# Patient Record
Sex: Female | Born: 1960 | Race: Black or African American | Hispanic: No | Smoking: Former smoker
Health system: Southern US, Community
[De-identification: ages and names within clinical notes are randomized; demographics above are authoritative.]

## PROBLEM LIST (undated history)

## (undated) DIAGNOSIS — D649 Anemia, unspecified: Secondary | ICD-10-CM

---

## 1988-01-17 HISTORY — PX: ABDOMINAL HYSTERECTOMY: SHX81

## 1997-12-09 ENCOUNTER — Ambulatory Visit (HOSPITAL_COMMUNITY): Admission: RE | Admit: 1997-12-09 | Discharge: 1997-12-09 | Payer: Self-pay | Admitting: Gastroenterology

## 1997-12-09 ENCOUNTER — Encounter: Payer: Self-pay | Admitting: Gastroenterology

## 1998-03-10 ENCOUNTER — Other Ambulatory Visit: Admission: RE | Admit: 1998-03-10 | Discharge: 1998-03-10 | Payer: Self-pay | Admitting: Obstetrics

## 1999-08-11 ENCOUNTER — Other Ambulatory Visit: Admission: RE | Admit: 1999-08-11 | Discharge: 1999-08-11 | Payer: Self-pay | Admitting: Obstetrics

## 1999-08-30 ENCOUNTER — Ambulatory Visit (HOSPITAL_COMMUNITY): Admission: RE | Admit: 1999-08-30 | Discharge: 1999-08-30 | Payer: Self-pay | Admitting: Obstetrics

## 1999-08-30 ENCOUNTER — Encounter: Payer: Self-pay | Admitting: Obstetrics

## 2000-07-01 ENCOUNTER — Encounter: Payer: Self-pay | Admitting: Emergency Medicine

## 2000-07-01 ENCOUNTER — Emergency Department (HOSPITAL_COMMUNITY): Admission: EM | Admit: 2000-07-01 | Discharge: 2000-07-01 | Payer: Self-pay | Admitting: Emergency Medicine

## 2000-09-20 ENCOUNTER — Ambulatory Visit (HOSPITAL_COMMUNITY): Admission: RE | Admit: 2000-09-20 | Discharge: 2000-09-20 | Payer: Self-pay | Admitting: Obstetrics

## 2000-09-20 ENCOUNTER — Encounter: Payer: Self-pay | Admitting: Obstetrics

## 2003-01-17 HISTORY — PX: COLON SURGERY: SHX602

## 2005-07-07 ENCOUNTER — Emergency Department (HOSPITAL_COMMUNITY): Admission: EM | Admit: 2005-07-07 | Discharge: 2005-07-07 | Payer: Self-pay | Admitting: Emergency Medicine

## 2005-09-04 ENCOUNTER — Emergency Department (HOSPITAL_COMMUNITY): Admission: EM | Admit: 2005-09-04 | Discharge: 2005-09-05 | Payer: Self-pay | Admitting: Emergency Medicine

## 2006-01-02 ENCOUNTER — Encounter: Admission: RE | Admit: 2006-01-02 | Discharge: 2006-01-02 | Payer: Self-pay | Admitting: Internal Medicine

## 2008-03-26 ENCOUNTER — Emergency Department (HOSPITAL_COMMUNITY): Admission: EM | Admit: 2008-03-26 | Discharge: 2008-03-27 | Payer: Self-pay | Admitting: Emergency Medicine

## 2009-08-30 ENCOUNTER — Ambulatory Visit: Payer: Self-pay | Admitting: Internal Medicine

## 2009-08-30 ENCOUNTER — Encounter (INDEPENDENT_AMBULATORY_CARE_PROVIDER_SITE_OTHER): Payer: Self-pay | Admitting: Family Medicine

## 2009-08-30 LAB — CONVERTED CEMR LAB
ALT: 12 units/L (ref 0–35)
Chloride: 105 meq/L (ref 96–112)
Cholesterol: 231 mg/dL — ABNORMAL HIGH (ref 0–200)
Creatinine, Ser: 0.7 mg/dL (ref 0.40–1.20)
Glucose, Bld: 80 mg/dL (ref 70–99)
Iron: 27 ug/dL — ABNORMAL LOW (ref 42–145)
Lymphocytes Relative: 32 % (ref 12–46)
Lymphs Abs: 1.7 10*3/uL (ref 0.7–4.0)
Monocytes Absolute: 0.5 10*3/uL (ref 0.1–1.0)
Monocytes Relative: 9 % (ref 3–12)
Neutrophils Relative %: 56 % (ref 43–77)
Platelets: 439 10*3/uL — ABNORMAL HIGH (ref 150–400)
Potassium: 3.9 meq/L (ref 3.5–5.3)
RDW: 13.2 % (ref 11.5–15.5)
Sodium: 141 meq/L (ref 135–145)
Total Bilirubin: 0.3 mg/dL (ref 0.3–1.2)
Total Protein: 7.8 g/dL (ref 6.0–8.3)
UIBC: >450 ug/dL

## 2009-08-31 ENCOUNTER — Encounter (INDEPENDENT_AMBULATORY_CARE_PROVIDER_SITE_OTHER): Payer: Self-pay | Admitting: Family Medicine

## 2009-09-15 ENCOUNTER — Ambulatory Visit (HOSPITAL_COMMUNITY): Admission: RE | Admit: 2009-09-15 | Discharge: 2009-09-15 | Payer: Self-pay | Admitting: Family Medicine

## 2009-09-24 ENCOUNTER — Encounter: Admission: RE | Admit: 2009-09-24 | Discharge: 2009-09-24 | Payer: Self-pay | Admitting: Family Medicine

## 2010-04-28 LAB — DIFFERENTIAL
Basophils Relative: 0 % (ref 0–1)
Eosinophils Absolute: 0.1 10*3/uL (ref 0.0–0.7)
Eosinophils Relative: 1 % (ref 0–5)
Lymphocytes Relative: 34 % (ref 12–46)
Monocytes Absolute: 0.7 10*3/uL (ref 0.1–1.0)
Monocytes Relative: 12 % (ref 3–12)
Neutrophils Relative %: 53 % (ref 43–77)

## 2010-04-28 LAB — COMPREHENSIVE METABOLIC PANEL
AST: 17 U/L (ref 0–37)
Alkaline Phosphatase: 64 U/L (ref 39–117)
CO2: 25 mEq/L (ref 19–32)
Calcium: 9 mg/dL (ref 8.4–10.5)
Creatinine, Ser: 0.62 mg/dL (ref 0.4–1.2)
GFR calc non Af Amer: >60 mL/min (ref >60)
Glucose, Bld: 119 mg/dL — ABNORMAL HIGH (ref 70–99)
Sodium: 138 mEq/L (ref 135–145)

## 2010-04-28 LAB — CBC
HCT: 29.7 % — ABNORMAL LOW (ref 36.0–46.0)
MCHC: 33.5 g/dL (ref 30.0–36.0)
MCV: 87.5 fL (ref 78.0–100.0)
RBC: 3.39 MIL/uL — ABNORMAL LOW (ref 3.87–5.11)
RDW: 16 % — ABNORMAL HIGH (ref 11.5–15.5)
WBC: 5.8 10*3/uL (ref 4.0–10.5)

## 2010-04-28 LAB — LIPASE, BLOOD: Lipase: 25 U/L (ref 11–59)

## 2011-01-13 ENCOUNTER — Encounter: Payer: Self-pay | Admitting: *Deleted

## 2011-01-13 ENCOUNTER — Emergency Department (HOSPITAL_COMMUNITY): Payer: Self-pay

## 2011-01-13 ENCOUNTER — Emergency Department (HOSPITAL_COMMUNITY)
Admission: EM | Admit: 2011-01-13 | Discharge: 2011-01-13 | Disposition: A | Discharge status: Home / Self Care | Payer: Self-pay | Attending: Emergency Medicine | Admitting: Emergency Medicine

## 2011-01-13 DIAGNOSIS — R200 Anesthesia of skin: Secondary | ICD-10-CM

## 2011-01-13 DIAGNOSIS — M79609 Pain in unspecified limb: Secondary | ICD-10-CM | POA: Insufficient documentation

## 2011-01-13 DIAGNOSIS — M25559 Pain in unspecified hip: Secondary | ICD-10-CM | POA: Insufficient documentation

## 2011-01-13 DIAGNOSIS — R269 Unspecified abnormalities of gait and mobility: Secondary | ICD-10-CM | POA: Insufficient documentation

## 2011-01-13 DIAGNOSIS — R209 Unspecified disturbances of skin sensation: Secondary | ICD-10-CM | POA: Insufficient documentation

## 2011-01-13 HISTORY — DX: Anemia, unspecified: D64.9

## 2011-01-13 NOTE — ED Provider Notes (Signed)
Medical screening examination/treatment/procedure(s) were performed by non-physician practitioner and as supervising physician I was immediately available for consultation/collaboration.  Rufus Cypert T Aribelle Mccosh, MD 01/13/11 2341 

## 2011-01-13 NOTE — ED Provider Notes (Signed)
History     CSN: [id number]  Arrival date & time 01/13/11  1515   First MD Initiated Contact with Patient 01/13/11 1522      Chief Complaint  Patient presents with  . Leg Pain    (Consider location/radiation/quality/duration/timing/severity/associated sxs/prior treatment) HPI Comments: Patient states, that she's had left hip, leg pain with her foot being cool for the last 3 years.  This was following a dental procedure she has been seen by numerous physicians reassure her that there is no connection.  She has not seen Dr.  In over a year, because she's lost her insurance.  She has not taken any medication for this discomfort.  She feels as though her leg gives out on her when she is walking and her foot.  Always feels cold  Patient is a 50 y.o. female presenting with leg pain. The history is provided by the patient.  Leg Pain  The incident occurred more than 1 week ago. There was no injury mechanism. The pain is present in the left hip, left thigh, left ankle, left foot and left toes. The quality of the pain is described as aching. The pain is at a severity of 1/10. The pain is mild. The pain has been constant since onset. Associated symptoms include muscle weakness. Pertinent negatives include no numbness. The symptoms are aggravated by nothing. She has tried nothing for the symptoms.    Past Medical History  Diagnosis Date  . Anemia     History reviewed. No pertinent past surgical history.  History reviewed. No pertinent family history.  History  Substance Use Topics  . Smoking status: Current Everyday Smoker  . Smokeless tobacco: Not on file  . Alcohol Use: No    OB History    Grav Para Term Preterm Abortions TAB SAB Ect Mult Living                  Review of Systems  Constitutional: Negative for activity change.  HENT: Negative for neck pain.   Cardiovascular: Negative for leg swelling.  Musculoskeletal: Positive for arthralgias and gait problem. Negative for  myalgias, back pain and joint swelling.  Skin: Negative for color change, rash and wound.  Neurological: Negative for numbness.  Hematological: Negative.   Psychiatric/Behavioral: Negative.     Allergies  Acetaminophen  Home Medications   Current Outpatient Rx  Name Route Sig Dispense Refill  . ACETAMINOPHEN 500 MG PO TABS Oral Take 500 mg by mouth every 6 (six) hours as needed. For pain relief     . FERROUS GLUCONATE 325 MG PO TABS Oral Take 325 mg by mouth daily with breakfast.      . NAPHAZOLINE-PHENIRAMINE 0.025-0.3 % OP SOLN Both Eyes Place 1 drop into both eyes 4 (four) times daily as needed. For dry eyes       BP 168/103  Pulse 118  Temp(Src) 98.3 F (36.8 C) (Oral)  Resp 20  SpO2 98%  Physical Exam  Constitutional: She is oriented to person, place, and time. She appears well-developed and well-nourished.  HENT:  Head: Normocephalic.  Eyes: Pupils are equal, round, and reactive to light.  Neck: Normal range of motion.  Cardiovascular: Normal rate.   Pulmonary/Chest: Effort normal.  Musculoskeletal: She exhibits no edema and no tenderness.       Left hip: She exhibits no tenderness, no bony tenderness, no swelling, no crepitus and no deformity.  Neurological: She is oriented to person, place, and time.  Skin: Skin is warm and  dry.       + diatal pulses bilaterai,l feet cool to touch, no color change cap refil less than 3 sec     ED Course  Procedures (including critical care time)  Labs Reviewed - No data to display Dg Hip Complete Left  01/13/2011  *RADIOLOGY REPORT*  Clinical Data: 3-day history of left hip pain with limp.  No known injuries.  LEFT HIP - COMPLETE 2+ VIEW 01/13/2011:  Comparison: None.  Findings: No evidence of acute or subacute fracture or dislocation. Well-preserved joint space.  Well-preserved bone mineral density.  Included AP pelvis demonstrates a normal-appearing contralateral right hip.  Sacroiliac joints and symphysis pubis intact.  Visualized lower lumbar spine intact.  No intrinsic osseous abnormalities.  Note made of numerous pelvic phleboliths.  IMPRESSION: Normal examination.  Original Report Authenticated By: Arnell Sieving, M.D.     1. Numbness in left leg       MDM  This sensation of coldness, and weakness to the left leg has been persistent for the last 3 years, worse in the last 6 months.  Has not seen a primary care physician in the last year to 2 insurance problems.  She is able to ambulate, but feels as though the left leg is weak and will give out, she is also concerned that the foot was feels cold and then an appears of socks  she wears or external warming measures         Arman Filter, NP 01/13/11 2009  Arman Filter, NP 01/13/11 2014

## 2011-01-13 NOTE — ED Notes (Signed)
Pt. states this all started after a root canal that was done 3 years ago. Has been unable to sleep on her left side since then. Pt. states she slept on her left side 2 days and now unable to walk due to the pain and her foot in cold.

## 2011-01-13 NOTE — ED Notes (Signed)
Pt in c/o left leg pain x3 years, increased over last 2 days to where patient has increased pain with walking, states her left foot stays cold, CMS intact

## 2011-06-26 ENCOUNTER — Other Ambulatory Visit: Payer: Self-pay | Admitting: Family Medicine

## 2011-06-26 DIAGNOSIS — N63 Unspecified lump in unspecified breast: Secondary | ICD-10-CM

## 2011-07-04 ENCOUNTER — Ambulatory Visit
Admission: RE | Admit: 2011-07-04 | Discharge: 2011-07-04 | Disposition: A | Discharge status: Home / Self Care | Payer: No Typology Code available for payment source | Source: Ambulatory Visit | Attending: Family Medicine | Admitting: Family Medicine

## 2011-07-04 DIAGNOSIS — N63 Unspecified lump in unspecified breast: Secondary | ICD-10-CM

## 2011-07-05 ENCOUNTER — Encounter (HOSPITAL_COMMUNITY): Payer: Self-pay | Admitting: *Deleted

## 2011-07-05 ENCOUNTER — Emergency Department (HOSPITAL_COMMUNITY)
Admission: EM | Admit: 2011-07-05 | Discharge: 2011-07-05 | Disposition: A | Discharge status: Home / Self Care | Payer: Self-pay | Attending: Emergency Medicine | Admitting: Emergency Medicine

## 2011-07-05 DIAGNOSIS — F17200 Nicotine dependence, unspecified, uncomplicated: Secondary | ICD-10-CM | POA: Insufficient documentation

## 2011-07-05 DIAGNOSIS — N938 Other specified abnormal uterine and vaginal bleeding: Secondary | ICD-10-CM | POA: Insufficient documentation

## 2011-07-05 DIAGNOSIS — N63 Unspecified lump in unspecified breast: Secondary | ICD-10-CM | POA: Insufficient documentation

## 2011-07-05 DIAGNOSIS — N949 Unspecified condition associated with female genital organs and menstrual cycle: Secondary | ICD-10-CM | POA: Insufficient documentation

## 2011-07-05 LAB — DIFFERENTIAL
Basophils Absolute: 0 10*3/uL (ref 0.0–0.1)
Eosinophils Absolute: 0.1 10*3/uL (ref 0.0–0.7)
Eosinophils Relative: 1 % (ref 0–5)
Lymphocytes Relative: 36 % (ref 12–46)
Lymphs Abs: 1.8 10*3/uL (ref 0.7–4.0)
Monocytes Absolute: 0.4 10*3/uL (ref 0.1–1.0)
Monocytes Relative: 8 % (ref 3–12)
Neutro Abs: 2.7 10*3/uL (ref 1.7–7.7)

## 2011-07-05 LAB — CBC: MCV: 92.2 fL (ref 78.0–100.0)

## 2011-07-05 LAB — URINALYSIS, ROUTINE W REFLEX MICROSCOPIC
Bilirubin Urine: NEGATIVE
Ketones, ur: NEGATIVE mg/dL
Nitrite: NEGATIVE
Protein, ur: NEGATIVE mg/dL
Urobilinogen, UA: 0.2 mg/dL (ref 0.0–1.0)
pH: 5.5 (ref 5.0–8.0)

## 2011-07-05 LAB — URINE MICROSCOPIC-ADD ON

## 2011-07-05 LAB — APTT: aPTT: 36 seconds (ref 24–37)

## 2011-07-05 LAB — PROTIME-INR: Prothrombin Time: 12.9 seconds (ref 11.6–15.2)

## 2011-07-05 MED ORDER — SODIUM CHLORIDE 0.9 % IV BOLUS (SEPSIS)
1000.0000 mL | Freq: Once | INTRAVENOUS | Status: AC
  Administered 2011-07-05: 1000 mL via INTRAVENOUS

## 2011-07-05 MED ORDER — NAPROXEN 500 MG PO TABS: 500.0000 mg | ORAL_TABLET | Freq: Two times a day (BID) | ORAL | Status: AC

## 2011-07-05 NOTE — ED Notes (Signed)
Pt reports vaginal bleeding x18 days. States she has hx of cyst/ fibroid. Admits feeling nauseous at times. Denies otc medication or relief of pain

## 2011-07-05 NOTE — ED Notes (Signed)
Pt reports hx of abnormal menstrual cycles. States cycles have been prolonged for past few years and abnormally heavy. Pt has hx of anemia. Reports clots present with menses. Hx of cysts

## 2011-07-05 NOTE — Discharge Instructions (Signed)
Abnormal Uterine Bleeding Abnormal uterine bleeding can have many causes. Some cases are simply treated, while others are more serious. There are several kinds of bleeding that is considered abnormal, including:  Bleeding between periods.   Bleeding after sexual intercourse.   Spotting anytime in the menstrual cycle.   Bleeding heavier or more than normal.   Bleeding after menopause.  CAUSES  There are many causes of abnormal uterine bleeding. It can be present in teenagers, pregnant women, women during their reproductive years, and women who have reached menopause. Your caregiver will look for the more common causes depending on your age, signs, symptoms and your particular circumstance. Most cases are not serious and can be treated. Even the more serious causes, like cancer of the female organs, can be treated adequately if found in the early stages. That is why all types of bleeding should be evaluated and treated as soon as possible. DIAGNOSIS  Diagnosing the cause may take several kinds of tests. Your caregiver may:  Take a complete history of the type of bleeding.   Perform a complete physical exam and Pap smear.   Take an ultrasound on the abdomen showing a picture of the female organs and the pelvis.   Inject dye into the uterus and Fallopian tubes and X-ray them (hysterosalpingogram).   Place fluid in the uterus and do an ultrasound (sonohysterogrqphy).   Take a CT scan to examine the female organs and pelvis.   Take an MRI to examine the female organs and pelvis. There is no X-ray involved with this procedure.   Look inside the uterus with a telescope that has a light at the end (hysteroscopy).   Scrap the inside of the uterus to get tissue to examine (Dilatation and Curettage, D&C).   Look into the pelvis with a telescope that has a light at the end (laparoscopy). This is done through a very small cut (incision) in the abdomen.  TREATMENT  Treatment will depend on the  cause of the abnormal bleeding. It can include:  Doing nothing to allow the problem to take care of itself over time.   Hormone treatment.   Birth control pills.   Treating the medical condition causing the problem.   Laparoscopy.   Major or minor surgery   Destroying the lining of the uterus with electrical currant, laser, freezing or heat (uterine ablation).  HOME CARE INSTRUCTIONS   Follow your caregiver's recommendation on how to treat your problem.   See your caregiver if you missed a menstrual period and think you may be pregnant.   If you are bleeding heavily, count the number of pads/tampons you use and how often you have to change them. Tell this to your caregiver.   Avoid sexual intercourse until the problem is controlled.  SEEK MEDICAL CARE IF:   You have any kind of abnormal bleeding mentioned above.   You feel dizzy at times.   You are 51 years old and have not had a menstrual period yet.  SEEK IMMEDIATE MEDICAL CARE IF:   You pass out.   You are changing pads/tampons every 15 to 30 minutes.   You have belly (abdominal) pain.   You have a temperature of 100 F (37.8 C) or higher.   You become sweaty or weak.   You are passing large blood clots from the vagina.   You start to feel sick to your stomach (nauseous) and throw up (vomit).  Document Released: 01/02/2005 Document Revised: 12/22/2010 Document Reviewed: 05/28/2008 [employer]   Patient Information 2012 [employer]. 

## 2011-07-05 NOTE — ED Provider Notes (Signed)
History     CSN: [id number]  Arrival date & time 07/05/11  1404   First MD Initiated Contact with Patient 07/05/11 1701      Chief Complaint  Patient presents with  . Vaginal Bleeding    (Consider location/radiation/quality/duration/timing/severity/associated sxs/prior treatment) HPI Comments: States that she does not currently have an ob/gyn physician  Patient is a 51 y.o. female presenting with vaginal bleeding. The history is provided by the patient. No language interpreter was used.  Vaginal Bleeding This is a recurrent problem. The current episode started more than 2 days ago (18 days ago). The problem occurs constantly. The problem has been gradually worsening (has been using 4-5 night time pads daily.). Pertinent negatives include no chest pain, no headaches and no shortness of breath. Nothing aggravates the symptoms. Nothing relieves the symptoms. She has tried nothing for the symptoms. The treatment provided no relief.    Past Medical History  Diagnosis Date  . Anemia   . Anemia     History reviewed. No pertinent past surgical history.  No family history on file.  History  Substance Use Topics  . Smoking status: Current Everyday Smoker    Types: Cigarettes  . Smokeless tobacco: Not on file  . Alcohol Use: No    OB History    Grav Para Term Preterm Abortions TAB SAB Ect Mult Living                  Review of Systems  Constitutional: Positive for fatigue. Negative for fever, chills, activity change and appetite change.  HENT: Negative for congestion, sore throat, rhinorrhea, neck pain and neck stiffness.   Respiratory: Negative for cough and shortness of breath.   Cardiovascular: Negative for chest pain and palpitations.  Gastrointestinal: Negative for nausea and vomiting.  Genitourinary: Positive for urgency, frequency and vaginal bleeding. Negative for dysuria, flank pain, vaginal discharge, vaginal pain and pelvic pain.  Musculoskeletal: Negative for  myalgias, back pain and arthralgias.  Neurological: Positive for light-headedness. Negative for dizziness, weakness, numbness and headaches.  All other systems reviewed and are negative.    Allergies  Acetaminophen  Home Medications   Current Outpatient Rx  Name Route Sig Dispense Refill  . FERROUS GLUCONATE 325 MG PO TABS Oral Take 325 mg by mouth daily with breakfast.      . NAPHAZOLINE-PHENIRAMINE 0.025-0.3 % OP SOLN Both Eyes Place 1 drop into both eyes 4 (four) times daily as needed. For dry eyes     . RANITIDINE HCL 150 MG PO TABS Oral Take 150 mg by mouth 2 (two) times daily.    Marland Kitchen NAPROXEN 500 MG PO TABS Oral Take 1 tablet (500 mg total) by mouth 2 (two) times daily. 30 tablet 0    BP 121/70  Pulse 80  Temp 98.8 F (37.1 C) (Oral)  Resp 16  Ht 5\' 2"  (1.575 m)  Wt 135 lb (61.236 kg)  BMI 24.69 kg/m2  SpO2 100%  LMP 07/05/2011  Physical Exam  Nursing note and vitals reviewed. Constitutional: She is oriented to person, place, and time. She appears well-developed and well-nourished. No distress.  HENT:  Head: Normocephalic and atraumatic.  Mouth/Throat: Oropharynx is clear and moist.  Eyes: Conjunctivae and EOM are normal. Pupils are equal, round, and reactive to light.  Neck: Normal range of motion. Neck supple.  Cardiovascular: Normal rate, regular rhythm, normal heart sounds and intact distal pulses.  Exam reveals no gallop and no friction rub.   No murmur heard. Pulmonary/Chest: Effort  normal and breath sounds normal. No respiratory distress. She exhibits no tenderness.  Abdominal: Soft. Bowel sounds are normal. There is no tenderness. There is no rebound and no guarding.  Genitourinary: Cervix exhibits no motion tenderness. Right adnexum displays no mass, no tenderness and no fullness. Left adnexum displays no mass, no tenderness and no fullness.       Vaginal bleeding in vault  Musculoskeletal: Normal range of motion. She exhibits no edema and no tenderness.    Neurological: She is alert and oriented to person, place, and time. No cranial nerve deficit.  Skin: Skin is warm and dry. No rash noted.    ED Course  Procedures (including critical care time)   Date: 07/05/2011  Rate: 60  Rhythm: normal sinus rhythm  QRS Axis: normal  Intervals: normal  ST/T Wave abnormalities: normal  Conduction Disutrbances:none  Narrative Interpretation:   Old EKG Reviewed: unchanged  Labs Reviewed  URINALYSIS, ROUTINE W REFLEX MICROSCOPIC - Abnormal; Notable for the following:    Hgb urine dipstick SMALL (*)     All other components within normal limits  CBC - Abnormal; Notable for the following:    RBC 3.84 (*)     HCT 35.4 (*)     Platelets 439 (*)     All other components within normal limits  PREGNANCY, URINE  DIFFERENTIAL  PROTIME-INR  APTT  URINE MICROSCOPIC-ADD ON  GC/CHLAMYDIA PROBE AMP, GENITAL   US Breast Right  07/04/2011  *RADIOLOGY REPORT*  Clinical Data:  Palpable lump right breast  DIGITAL DIAGNOSTIC BILATERAL MAMMOGRAM WITH CAD AND RIGHT BREAST ULTRASOUND:  Comparison:  September 24, 2009, September 15, 2009  Findings:  CC and MLO views of bilateral breasts, spot tangential view of the right breast are submitted.   In the palpable area right breast, there are two adjacent massed.  Stable asymmetries identified in both breast unchanged. Mammographic images were processed with CAD.  Ultrasound is performed, showing two adjacent simple cysts at the right breast eight to nine o'clock position.  The more anterior mass 8 o'clock cyst measures 2.2 x 1.7 x 2.2 cm.  The right breast nine o'clock cyst measures 1.81 x 1.94 x 1.1 cm.  There are several smaller adjacent simple cysts.  These correlate to the mammographic finding.  IMPRESSION: Benign findings  RECOMMENDATION: Recommend routine screening mammogram in 1 year.  BI-RADS CATEGORY 2:  Benign finding(s).  Original Report Authenticated By: Sherian Rein, M.D.   Mm Digital Diagnostic Bilat  07/04/2011   *RADIOLOGY REPORT*  Clinical Data:  Palpable lump right breast  DIGITAL DIAGNOSTIC BILATERAL MAMMOGRAM WITH CAD AND RIGHT BREAST ULTRASOUND:  Comparison:  September 24, 2009, September 15, 2009  Findings:  CC and MLO views of bilateral breasts, spot tangential view of the right breast are submitted.   In the palpable area right breast, there are two adjacent massed.  Stable asymmetries identified in both breast unchanged. Mammographic images were processed with CAD.  Ultrasound is performed, showing two adjacent simple cysts at the right breast eight to nine o'clock position.  The more anterior mass 8 o'clock cyst measures 2.2 x 1.7 x 2.2 cm.  The right breast nine o'clock cyst measures 1.81 x 1.94 x 1.1 cm.  There are several smaller adjacent simple cysts.  These correlate to the mammographic finding.  IMPRESSION: Benign findings  RECOMMENDATION: Recommend routine screening mammogram in 1 year.  BI-RADS CATEGORY 2:  Benign finding(s).  Original Report Authenticated By: Sherian Rein, M.D.     1. Dysfunctional  uterine bleeding       MDM  Dysfunctional uterine bleeding with no evidence of anemia. Instructed to continue her iron tablets. Instructed to followup with her primary care decision as well as an OB/GYN physician. There is no indication for OCPs at this time. Provided strict return precautions.        Dayton Bailiff, MD 07/05/11 2051

## 2011-07-06 LAB — GC/CHLAMYDIA PROBE AMP, GENITAL: Chlamydia, DNA Probe: NEGATIVE

## 2014-08-25 ENCOUNTER — Encounter: Payer: Self-pay | Admitting: *Deleted

## 2014-09-02 ENCOUNTER — Ambulatory Visit (INDEPENDENT_AMBULATORY_CARE_PROVIDER_SITE_OTHER): Payer: No Typology Code available for payment source | Admitting: Internal Medicine

## 2014-09-02 ENCOUNTER — Encounter: Payer: Self-pay | Admitting: Internal Medicine

## 2014-09-02 VITALS — BP 140/90 | HR 79 | Temp 98.1°F | Resp 18 | Ht 63.5 in | Wt 154.2 lb

## 2014-09-02 DIAGNOSIS — F1290 Cannabis use, unspecified, uncomplicated: Secondary | ICD-10-CM

## 2014-09-02 DIAGNOSIS — R5383 Other fatigue: Secondary | ICD-10-CM

## 2014-09-02 DIAGNOSIS — R030 Elevated blood-pressure reading, without diagnosis of hypertension: Secondary | ICD-10-CM | POA: Diagnosis not present

## 2014-09-02 DIAGNOSIS — F1210 Cannabis abuse, uncomplicated: Secondary | ICD-10-CM | POA: Diagnosis not present

## 2014-09-02 DIAGNOSIS — Z720 Tobacco use: Secondary | ICD-10-CM | POA: Diagnosis not present

## 2014-09-02 DIAGNOSIS — H539 Unspecified visual disturbance: Secondary | ICD-10-CM | POA: Diagnosis not present

## 2014-09-02 DIAGNOSIS — Z1239 Encounter for other screening for malignant neoplasm of breast: Secondary | ICD-10-CM | POA: Diagnosis not present

## 2014-09-02 DIAGNOSIS — N926 Irregular menstruation, unspecified: Secondary | ICD-10-CM | POA: Diagnosis not present

## 2014-09-02 DIAGNOSIS — M2550 Pain in unspecified joint: Secondary | ICD-10-CM | POA: Diagnosis not present

## 2014-09-02 DIAGNOSIS — R3589 Other polyuria: Secondary | ICD-10-CM

## 2014-09-02 DIAGNOSIS — R358 Other polyuria: Secondary | ICD-10-CM | POA: Diagnosis not present

## 2014-09-02 DIAGNOSIS — M79674 Pain in right toe(s): Secondary | ICD-10-CM

## 2014-09-02 DIAGNOSIS — D509 Iron deficiency anemia, unspecified: Secondary | ICD-10-CM

## 2014-09-02 DIAGNOSIS — R209 Unspecified disturbances of skin sensation: Secondary | ICD-10-CM | POA: Diagnosis not present

## 2014-09-02 DIAGNOSIS — IMO0001 Reserved for inherently not codable concepts without codable children: Secondary | ICD-10-CM

## 2014-09-02 MED ORDER — TRAMADOL HCL 50 MG PO TABS: 50.0000 mg | ORAL_TABLET | Freq: Four times a day (QID) | ORAL | Status: DC | PRN

## 2014-09-02 NOTE — Patient Instructions (Signed)
Will call with referrals to GYN, podiatry and optometry  Will call with mammogram appt  Will call with lab results  Watch salt intake  Follow up in 1 month for CPE

## 2014-09-02 NOTE — Progress Notes (Signed)
Patient ID: Lauren Ruiz, female   DOB: [birth year], 54 y.o.   MRN: [id number]    Location:    [facility]   Place of Service:   OFFICE   Advanced Directive information Does patient have an advance directive?: No, Would patient like information on creating an advanced directive?: Yes - Educational materials given  Chief Complaint  Patient presents with  . Establish Care    New patient establish care  . Medical Management of Chronic Issues    Discuss Chlantix would like to try    HPI:  54 yo female seen today as a new pt. She has several concerns. She has not had a PCP in several years. She c/o significant almost debilitating fatigue x several yrs not relieved with vitamins  Right 1st toe pain intermittent and worsens with walking. It prevents her from wearing most shoes. The pain is sharp, shooting and occasional tingling. No relief with OTC remedies  Her 1st toenail right is thick and discolored. No relief with OTC herbal remedies. She has not tried rx med. She has not seen podiatry  She has intermittent leg and finger cramps with intermittent tingling. C/a DM as she has a FHx. She noticed hand numbness. Job requires repetitive motions in the past.  She has generalized pain and has to massage joints prior to work x 20 minutes. Tylenol does not help. She admits to smoking marijuana at times which does help the pain.  She has 4-5 time nocturia x few yrs. (+) insomnia.   She noticed leg cramps most days of the week that awaken her from sleep x several mos  She would like to stop smoking cigarettes. Sister has asthma and is currently on life support. Her father and grandfather both deceased from lung cancer (both smokers).   She still has menses but they are irregular now. She has fibroids and hx iron deficiency. Occasional pelvic pain. She would like to see GYN  She has a stomach ulcer hx and takes prn zantac.  She has seen the dentist repeatedly for left facial swelling. W/u has been  neg thus far. No dry mouth.  Past Medical History  Diagnosis Date  . Anemia   . Anemia     Past Surgical History  Procedure Laterality Date  . Abdominal hysterectomy  1990  . Colon surgery  2005    [facility]    Patient Care Team: Kirt Boys, DO as PCP - General (Internal Medicine)  Social History   Social History  . Marital Status: Divorced    Spouse Name: N/A  . Number of Children: N/A  . Years of Education: N/A   Occupational History  . Not on file.   Social History Main Topics  . Smoking status: Current Every Day Smoker -- 32 years    Types: Cigarettes  . Smokeless tobacco: Never Used  . Alcohol Use: 0.0 oz/week    0 Standard drinks or equivalent per week  . Drug Use: Yes  . Sexual Activity: Not on file   Other Topics Concern  . Not on file   Social History Narrative   Diet: No specfic   Do you drink/eat things with caffeine? yes   Marital status: Divorced                             What year were you married?1988  Do you live in a house, apartment, assisted living, condo, trailer, etc)? House   Is it one or more stories? No   How many persons live in your home? 2   Do you have any pets in your home? No   Current or past profession: Investment banker, operational, Clinical biochemist Rep. Tax Preparer   Do you exercise? No                                               Type & how often: No   Do you have a living will? No   Do you have a DNR Form? No   Do you have a POA/HPOA forms? No        reports that she has been smoking Cigarettes.  She has smoked for the past 32 years. She has never used smokeless tobacco. She reports that she drinks alcohol. She reports that she uses illicit drugs.  History reviewed. No pertinent family history. No family status information on file.     There is no immunization history on file for this patient.  Allergies  Allergen Reactions  . Acetaminophen Other (See Comments)    ulcers     Medications: Patient's Medications  New Prescriptions   No medications on file  Previous Medications   FERROUS GLUCONATE (FERGON) 325 MG TABLET    Take 325 mg by mouth daily with breakfast.     FERROUS SULFATE DRIED (FEOSOL) 200 (65 FE) MG TABS    Take 1 tablet by mouth daily.   MAGNESIUM GLUCONATE (MAGONATE) 500 MG TABLET    Take 500 mg by mouth daily.   MULTIPLE VITAMINS-MINERALS (CENTRUM SILVER ULTRA WOMENS) TABS    Take 1 tablet by mouth daily.   NAPHAZOLINE-PHENIRAMINE (NAPHCON-A) 0.025-0.3 % OPHTHALMIC SOLUTION    Place 1 drop into both eyes 4 (four) times daily as needed. For dry eyes    RANITIDINE (ZANTAC) 150 MG TABLET    Take 150 mg by mouth 2 (two) times daily.  Modified Medications   No medications on file  Discontinued Medications   No medications on file    Review of Systems  Constitutional: Positive for appetite change (loss) and fatigue (constant). Negative for fever, chills, diaphoresis and activity change.  HENT: Positive for dental problem (gum pain) and facial swelling (left parotid. dental w/u neg). Negative for ear pain and sore throat.   Eyes: Positive for visual disturbance (she would like to see eye specialist).       Dry eyes  Respiratory: Positive for cough and wheezing. Negative for chest tightness and shortness of breath.   Cardiovascular: Negative for chest pain, palpitations and leg swelling.  Gastrointestinal: Positive for abdominal pain. Negative for nausea, vomiting, diarrhea, constipation and blood in stool.       Flatulence  Endocrine:       Hot flashes  Genitourinary: Positive for urgency, frequency and menstrual problem. Negative for dysuria.  Musculoskeletal: Positive for myalgias, joint swelling (with stiffness) and arthralgias.  Skin:       Nail abnormality   Neurological: Positive for numbness (paresthesias). Negative for dizziness, tremors and headaches.  Psychiatric/Behavioral: Negative for sleep disturbance. The patient is not  nervous/anxious.        Increased stress; mood swings    Filed Vitals:   09/02/14 1114  BP: 140/90  Pulse: 79  Temp: 98.1 F (36.7 C)  TempSrc: Oral  Resp: 18  Height: 5' 3.5" (1.613 m)  Weight: 154 lb 3.2 oz (69.945 kg)  SpO2: 98%   Body mass index is 26.88 kg/(m^2).  Physical Exam  Constitutional: She is oriented to person, place, and time. She appears well-developed and well-nourished.  Looks tired in NAD  HENT:  Mouth/Throat: Oropharynx is clear and moist. No oropharyngeal exudate.  Eyes: Pupils are equal, round, and reactive to light. No scleral icterus.  Neck: Neck supple. Carotid bruit is not present. No tracheal deviation present. No thyromegaly present.  Cardiovascular: Normal rate, regular rhythm, normal heart sounds and intact distal pulses.  Exam reveals no gallop and no friction rub.   No murmur heard. No LE edema b/l. no calf TTP.   Pulmonary/Chest: Effort normal. No stridor. No respiratory distress. She has decreased breath sounds (base b/l). She has no wheezes. She has no rales.  Abdominal: Soft. Bowel sounds are normal. She exhibits no distension and no mass. There is no hepatomegaly. There is tenderness (suprapubic with fullness noted). There is no rebound and no guarding.  Musculoskeletal: She exhibits edema and tenderness.  Left shoulder reduced ROM with crepitus on movement; neg Apley scratch test; (+) Right Tinel's sign; grip strength intact  Lymphadenopathy:    She has no cervical adenopathy.  Neurological: She is alert and oriented to person, place, and time. She has normal reflexes.  Skin: Skin is warm and dry. No rash noted.  Psychiatric: She has a normal mood and affect. Her behavior is normal. Judgment and thought content normal.   Diabetic Foot Exam - Simple   Simple Foot Form  Diabetic Foot exam was performed with the following findings:  Yes 09/02/2014 12:00 PM  Visual Inspection  See comments:  Yes  Sensation Testing  Intact to touch and  monofilament testing bilaterally:  Yes  Pulse Check  Posterior Tibialis and Dorsalis pulse intact bilaterally:  Yes  Comments  Right 1st toenail thick and dystrophic appearing, loose. Toe joint is TTP and swollen. No calluses or ulcerations. FROM at all MTP joints. No palpable masses        Labs reviewed: None available   Assessment/Plan   ICD-9-CM ICD-10-CM   1. Polyuria with polydipsia 788.42 R35.8 CMP     Urinalysis with Reflex Microscopic     Hemoglobin A1c  2. Toe pain, right 729.5 M79.674   3. Paresthesias/numbness 782.0 R20.9 CMP     TSH  4. Pain, joint, multiple sites 719.49 M25.50 traMADol (ULTRAM) 50 MG tablet  5. Anemia, iron deficiency 280.9 D50.9 CBC with Differential  6. Elevated blood pressure (not hypertension) 796.2 R03.0 Urinalysis with Reflex Microscopic  7. Continuous tobacco abuse 305.1 Z72.0 Urinalysis with Reflex Microscopic  8. Marijuana use 305.20 F12.10   9. Other fatigue 780.79 R53.83 CBC with Differential     CMP     TSH    --r/o DM - check A1c  --check CBC w diff, CMP, TSH, UA  --refer to GYN, podiatry, optometry  --refer for mammogram  --watch salt intake  --smoking cessation/marijuana use cessation discussed and highly urged  --may need to check CXR given sx's and FHx  --f/u for CPE in 1 month. She has multiple concerns that will need to be addressed in the coming appts  Devlyn Parish S. Ancil Linsey  [facility] [address] [place], [place] [address] [phone] Cell (Monday-Friday 8 AM - 5 PM) [phone] After 5 PM and follow prompts

## 2014-09-03 LAB — COMPREHENSIVE METABOLIC PANEL
A/G RATIO: 1.7 (ref 1.1–2.5)
ALBUMIN: 4.5 g/dL (ref 3.5–5.5)
ALK PHOS: 74 IU/L (ref 39–117)
ALT: 23 IU/L (ref 0–32)
AST: 23 IU/L (ref 0–40)
BILIRUBIN TOTAL: 0.4 mg/dL (ref 0.0–1.2)
BUN / CREAT RATIO: 18 (ref 9–23)
BUN: 12 mg/dL (ref 6–24)
CHLORIDE: 99 mmol/L (ref 97–108)
CO2: 25 mmol/L (ref 18–29)
Calcium: 9.6 mg/dL (ref 8.7–10.2)
Creatinine, Ser: 0.65 mg/dL (ref 0.57–1.00)
GFR calc non Af Amer: 102 mL/min/{1.73_m2} (ref >59)
GFR, EST AFRICAN AMERICAN: 118 mL/min/{1.73_m2} (ref >59)
GLOBULIN, TOTAL: 2.6 g/dL (ref 1.5–4.5)
Glucose: 80 mg/dL (ref 65–99)
Potassium: 4.3 mmol/L (ref 3.5–5.2)
SODIUM: 138 mmol/L (ref 134–144)
TOTAL PROTEIN: 7.1 g/dL (ref 6.0–8.5)

## 2014-09-03 LAB — CBC WITH DIFFERENTIAL/PLATELET
BASOS ABS: 0 10*3/uL (ref 0.0–0.2)
BASOS: 0 %
EOS (ABSOLUTE): 0 10*3/uL (ref 0.0–0.4)
Eos: 1 %
HEMATOCRIT: 41.8 % (ref 34.0–46.6)
HEMOGLOBIN: 14 g/dL (ref 11.1–15.9)
IMMATURE GRANS (ABS): 0 10*3/uL (ref 0.0–0.1)
Immature Granulocytes: 0 %
LYMPHS ABS: 1.2 10*3/uL (ref 0.7–3.1)
LYMPHS: 39 %
MCH: 32.9 pg (ref 26.6–33.0)
MCHC: 33.5 g/dL (ref 31.5–35.7)
MCV: 98 fL — AB (ref 79–97)
MONOCYTES: 9 %
Monocytes Absolute: 0.3 10*3/uL (ref 0.1–0.9)
NEUTROS ABS: 1.6 10*3/uL (ref 1.4–7.0)
Neutrophils: 51 %
Platelets: 352 10*3/uL (ref 150–379)
RBC: 4.25 x10E6/uL (ref 3.77–5.28)
RDW: 13.9 % (ref 12.3–15.4)
WBC: 3.1 10*3/uL — ABNORMAL LOW (ref 3.4–10.8)

## 2014-09-03 LAB — URINALYSIS, ROUTINE W REFLEX MICROSCOPIC
Bilirubin, UA: NEGATIVE
GLUCOSE, UA: NEGATIVE
Ketones, UA: NEGATIVE
Leukocytes, UA: NEGATIVE
Nitrite, UA: NEGATIVE
PH UA: 6 (ref 5.0–7.5)
PROTEIN UA: NEGATIVE
RBC, UA: NEGATIVE
Specific Gravity, UA: 1.021 (ref 1.005–1.030)
UUROB: 0.2 mg/dL (ref 0.2–1.0)

## 2014-09-03 LAB — TSH: TSH: 1.43 u[IU]/mL (ref 0.450–4.500)

## 2014-09-03 LAB — HEMOGLOBIN A1C
ESTIMATED AVERAGE GLUCOSE: 108 mg/dL
HEMOGLOBIN A1C: 5.4 % (ref 4.8–5.6)

## 2014-09-16 ENCOUNTER — Ambulatory Visit (INDEPENDENT_AMBULATORY_CARE_PROVIDER_SITE_OTHER): Payer: No Typology Code available for payment source

## 2014-09-16 ENCOUNTER — Ambulatory Visit (INDEPENDENT_AMBULATORY_CARE_PROVIDER_SITE_OTHER): Payer: No Typology Code available for payment source | Admitting: Podiatry

## 2014-09-16 VITALS — BP 125/79 | HR 79 | Resp 16 | Ht 62.0 in | Wt 154.0 lb

## 2014-09-16 DIAGNOSIS — M79671 Pain in right foot: Secondary | ICD-10-CM

## 2014-09-16 DIAGNOSIS — B351 Tinea unguium: Secondary | ICD-10-CM | POA: Diagnosis not present

## 2014-09-16 DIAGNOSIS — M779 Enthesopathy, unspecified: Secondary | ICD-10-CM | POA: Diagnosis not present

## 2014-09-16 DIAGNOSIS — M79672 Pain in left foot: Secondary | ICD-10-CM

## 2014-09-16 DIAGNOSIS — M79673 Pain in unspecified foot: Secondary | ICD-10-CM

## 2014-09-16 MED ORDER — TERBINAFINE HCL 250 MG PO TABS: 250.0000 mg | ORAL_TABLET | Freq: Every day | ORAL | Status: DC

## 2014-09-16 NOTE — Patient Instructions (Signed)

## 2014-09-16 NOTE — Progress Notes (Signed)
   Subjective:    Patient ID: Lauren Ruiz, female    DOB: Jun 20, 1960, 54 y.o.   MRN: [id number]  HPI  Patient presents with bilateral toe pain, great toes; discoloration of nails and thickness of nails. Pt stated, "has shooting pain in toes, like a prickly feeling; hurts to touch". This has been going on for the past year.  Patient also presents with bilateral foot pain, heel and below ankle. This has been going on for past year. Pt has soaked with epsom salt with some relief.      Review of Systems  Constitutional: Positive for fatigue.  Eyes: Positive for redness, itching and visual disturbance.  Respiratory: Positive for wheezing.   Cardiovascular: Positive for leg swelling.  Endocrine: Positive for cold intolerance, polydipsia and polyuria.  Genitourinary: Positive for urgency and frequency.  All other systems reviewed and are negative.      Objective:   Physical Exam        Assessment & Plan:

## 2014-09-16 NOTE — Progress Notes (Signed)
Subjective:     Patient ID: Lauren Ruiz, female   DOB: 1960/07/04, 54 y.o.   MRN: [id number]  HPI patient is found to have thickness and yellow brittle-like appearance to the big toenail second toenail of both feet with about one year duration and no history of trauma   Review of Systems  All other systems reviewed and are negative.      Objective:   Physical Exam  Constitutional: She is oriented to person, place, and time.  Cardiovascular: Intact distal pulses.   Musculoskeletal: Normal range of motion.  Neurological: She is oriented to person, place, and time.  Skin: Skin is warm and dry.  Nursing note and vitals reviewed.  neurovascular status intact muscle strength adequate range of motion within normal limits. Patient's noted to have thickness and yellow brittle debris of the hallux and second nails bilateral with distal odor noted and localized-like process is going     Assessment:     Probable mycotic nail infection hallux and second nails bilateral    Plan:     H&P and condition reviewed with patient. Due to long-standing nature I did go ahead and recommended oral treatment and explain risk and reviewed her blood work that was just done several weeks ago indicating good liver function. Patient will take Lamisil 1 pill per day for 90 days and also begin topical agent and reappoint in 4 weeks or earlier if necessary

## 2014-09-28 ENCOUNTER — Ambulatory Visit
Admission: RE | Admit: 2014-09-28 | Discharge: 2014-09-28 | Disposition: A | Discharge status: Home / Self Care | Payer: No Typology Code available for payment source | Source: Ambulatory Visit | Attending: Internal Medicine | Admitting: Internal Medicine

## 2014-09-28 DIAGNOSIS — Z1239 Encounter for other screening for malignant neoplasm of breast: Secondary | ICD-10-CM

## 2014-10-07 ENCOUNTER — Encounter: Payer: Self-pay | Admitting: Internal Medicine

## 2014-10-07 ENCOUNTER — Ambulatory Visit (INDEPENDENT_AMBULATORY_CARE_PROVIDER_SITE_OTHER): Payer: No Typology Code available for payment source | Admitting: Internal Medicine

## 2014-10-07 VITALS — BP 126/92 | HR 86 | Temp 98.4°F | Resp 18 | Ht 62.0 in | Wt 160.4 lb

## 2014-10-07 DIAGNOSIS — D509 Iron deficiency anemia, unspecified: Secondary | ICD-10-CM | POA: Diagnosis not present

## 2014-10-07 DIAGNOSIS — Z1211 Encounter for screening for malignant neoplasm of colon: Secondary | ICD-10-CM | POA: Diagnosis not present

## 2014-10-07 DIAGNOSIS — D72819 Decreased white blood cell count, unspecified: Secondary | ICD-10-CM

## 2014-10-07 DIAGNOSIS — Z0000 Encounter for general adult medical examination without abnormal findings: Secondary | ICD-10-CM | POA: Diagnosis not present

## 2014-10-07 DIAGNOSIS — M2550 Pain in unspecified joint: Secondary | ICD-10-CM

## 2014-10-07 DIAGNOSIS — R030 Elevated blood-pressure reading, without diagnosis of hypertension: Secondary | ICD-10-CM | POA: Diagnosis not present

## 2014-10-07 DIAGNOSIS — Z720 Tobacco use: Secondary | ICD-10-CM | POA: Diagnosis not present

## 2014-10-07 NOTE — Progress Notes (Signed)
Patient ID: Lauren Ruiz, female   DOB: 06/19/1960, 54 y.o.   MRN: [id number] Subjective:     Lauren Ruiz is a 54 y.o. female and is here for a comprehensive physical exam. The patient reports no problems. She had a birthday since her last visit. Admits to poor food choices. She is not exercising. She is still smoking in the evenings (1/4 ppd).   She saw podiatry for foot pain. Nail cx taken but she has not rec'd results. In soles did not relieve foot pain  Tramadol did not relieve pain but it made her sleepy. She tried 1 tab  Past Medical History  Diagnosis Date  . Anemia   . Anemia    Past Surgical History  Procedure Laterality Date  . Abdominal hysterectomy  1990  . Colon surgery  2005    [facility]   History reviewed. No pertinent family history.   Social History   Social History  . Marital Status: Divorced    Spouse Name: N/A  . Number of Children: N/A  . Years of Education: N/A   Occupational History  . Not on file.   Social History Main Topics  . Smoking status: Current Every Day Smoker -- 32 years    Types: Cigarettes  . Smokeless tobacco: Never Used  . Alcohol Use: 0.0 oz/week    0 Standard drinks or equivalent per week  . Drug Use: Yes    Special: Marijuana  . Sexual Activity: Not on file   Other Topics Concern  . Not on file   Social History Narrative   Diet: No specfic   Do you drink/eat things with caffeine? yes   Marital status: Divorced                             What year were you married?1988                                      Do you live in a house, apartment, assisted living, condo, trailer, etc)? House   Is it one or more stories? No   How many persons live in your home? 2   Do you have any pets in your home? No   Current or past profession: Investment banker, operational, Clinical biochemist Rep. Tax Preparer   Do you exercise? No                                               Type & how often: No   Do you have a living will? No   Do you have a DNR Form?  No   Do you have a POA/HPOA forms? No      Health Maintenance  Topic Date Due  . Hepatitis C Screening  April 06, 1960  . HIV Screening  09/20/1976  . TETANUS/TDAP  09/20/1980  . PAP SMEAR  09/21/1982  . COLONOSCOPY  09/21/2011  . INFLUENZA VACCINE  08/17/2014  . MAMMOGRAM  09/27/2016    Review of Systems   Review of Systems  Constitutional: Negative for fever, chills and malaise/fatigue.  HENT: Negative for sore throat and tinnitus.   Eyes: Negative for blurred vision and double vision.  Respiratory: Negative for cough, shortness of breath and wheezing.  Cardiovascular: Negative for chest pain, palpitations, orthopnea and leg swelling.  Gastrointestinal: Negative for heartburn, nausea, vomiting, abdominal pain, diarrhea, constipation and blood in stool.  Genitourinary: Negative for dysuria, urgency, frequency and hematuria.  Musculoskeletal: Positive for joint pain. Negative for myalgias and falls.  Skin: Negative for rash.  Neurological: Positive for tingling. Negative for dizziness, tremors, sensory change, focal weakness, seizures, loss of consciousness, weakness and headaches.  Endo/Heme/Allergies: Negative for environmental allergies. Does not bruise/bleed easily.  Psychiatric/Behavioral: Negative for depression and memory loss. The patient is not nervous/anxious and does not have insomnia.      Objective:      Physical Exam  Constitutional: She is oriented to person, place, and time and well-developed, well-nourished, and in no distress.  HENT:  Head: Normocephalic and atraumatic.  Right Ear: External ear normal.  Left Ear: External ear normal.  Mouth/Throat: Oropharynx is clear and moist. No oropharyngeal exudate.  Eyes: Conjunctivae and EOM are normal. Pupils are equal, round, and reactive to light. No scleral icterus.  Neck: Normal range of motion. Neck supple. Carotid bruit is not present. No tracheal deviation present. No thyromegaly present.  Cardiovascular:  Normal rate, regular rhythm, normal heart sounds and intact distal pulses.  Exam reveals no gallop and no friction rub.   No murmur heard. Pulmonary/Chest: Effort normal and breath sounds normal. She has no wheezes. She has no rhonchi. She has no rales. She exhibits no tenderness. Right breast exhibits no inverted nipple, no mass, no nipple discharge, no skin change and no tenderness. Left breast exhibits no inverted nipple, no mass, no nipple discharge, no skin change and no tenderness. Breasts are symmetrical.  Abdominal: Soft. Bowel sounds are normal. She exhibits no distension and no mass. There is no hepatosplenomegaly. There is no tenderness. There is no rebound and no guarding.  Genitourinary:  Deferred to GYN  Musculoskeletal: She exhibits edema and tenderness.  Lymphadenopathy:    She has no cervical adenopathy.  Neurological: She is alert and oriented to person, place, and time. She has normal reflexes. Gait normal.  Skin: Skin is warm and dry. No rash noted.  B/l 1st and 2nd toenail yellow and thick. (+)dystrophy  Psychiatric: Mood, memory, affect and judgment normal.      Recent Results (from the past 2160 hour(s))  CBC with Differential     Status: Abnormal   Collection Time: 09/02/14 12:43 PM  Result Value Ref Range   WBC 3.1 (L) 3.4 - 10.8 x10E3/uL   RBC 4.25 3.77 - 5.28 x10E6/uL   Hemoglobin 14.0 11.1 - 15.9 g/dL   Hematocrit 09.8 11.9 - 46.6 %   MCV 98 (H) 79 - 97 fL   MCH 32.9 26.6 - 33.0 pg   MCHC 33.5 31.5 - 35.7 g/dL   RDW 14.7 82.9 - 56.2 %   Platelets 352 150 - 379 x10E3/uL   Neutrophils 51 %   Lymphs 39 %   Monocytes 9 %   Eos 1 %   Basos 0 %   Neutrophils Absolute 1.6 1.4 - 7.0 x10E3/uL   Lymphocytes Absolute 1.2 0.7 - 3.1 x10E3/uL   Monocytes Absolute 0.3 0.1 - 0.9 x10E3/uL   EOS (ABSOLUTE) 0.0 0.0 - 0.4 x10E3/uL   Basophils Absolute 0.0 0.0 - 0.2 x10E3/uL   Immature Granulocytes 0 %   Immature Grans (Abs) 0.0 0.0 - 0.1 x10E3/uL  CMP     Status: None    Collection Time: 09/02/14 12:43 PM  Result Value Ref Range   Glucose 80  65 - 99 mg/dL   BUN 12 6 - 24 mg/dL   Creatinine, Ser 1.61 0.57 - 1.00 mg/dL   GFR calc non Af Amer 102 >59 mL/min/1.73   GFR calc Af Amer 118 >59 mL/min/1.73   BUN/Creatinine Ratio 18 9 - 23   Sodium 138 134 - 144 mmol/L   Potassium 4.3 3.5 - 5.2 mmol/L   Chloride 99 97 - 108 mmol/L   CO2 25 18 - 29 mmol/L   Calcium 9.6 8.7 - 10.2 mg/dL   Total Protein 7.1 6.0 - 8.5 g/dL   Albumin 4.5 3.5 - 5.5 g/dL   Globulin, Total 2.6 1.5 - 4.5 g/dL   Albumin/Globulin Ratio 1.7 1.1 - 2.5   Bilirubin Total 0.4 0.0 - 1.2 mg/dL   Alkaline Phosphatase 74 39 - 117 IU/L   AST 23 0 - 40 IU/L   ALT 23 0 - 32 IU/L  TSH     Status: None   Collection Time: 09/02/14 12:43 PM  Result Value Ref Range   TSH 1.430 0.450 - 4.500 uIU/mL  Hemoglobin A1c     Status: None   Collection Time: 09/02/14 12:43 PM  Result Value Ref Range   Hgb A1c MFr Bld 5.4 4.8 - 5.6 %    Comment:          Pre-diabetes: 5.7 - 6.4          Diabetes: >6.4          Glycemic control for adults with diabetes: <7.0    Est. average glucose Bld gHb Est-mCnc 108 mg/dL  Urinalysis with Reflex Microscopic     Status: None   Collection Time: 09/02/14 12:45 PM  Result Value Ref Range   Specific Gravity, UA 1.021 1.005 - 1.030   pH, UA 6.0 5.0 - 7.5   Color, UA Yellow Yellow   Appearance Ur Clear Clear   Leukocytes, UA Negative Negative   Protein, UA Negative Negative/Trace   Glucose, UA Negative Negative   Ketones, UA Negative Negative   RBC, UA Negative Negative   Bilirubin, UA Negative Negative   Urobilinogen, Ur 0.2 0.2 - 1.0 mg/dL   Nitrite, UA Negative Negative   Microscopic Examination Comment     Comment: Microscopic not indicated and not performed.    Assessment:    Healthy female exam.     ICD-9-CM ICD-10-CM   1. Well adult exam V70.0 Z00.00   2. Leukopenia - probably benign finding 288.50 D72.819 CBC with Differential  3. Pain, joint,  multiple sites - uncontrolled 719.49 M25.50   4. Anemia, iron deficiency 280.9 D50.9   5. Elevated blood pressure (not hypertension) 796.2 R03.0   6. Continuous tobacco abuse 305.1 Z72.0   7. Special screening for malignant neoplasms, colon V76.51 Z12.11 Ambulatory referral to Gastroenterology     Plan:     See After Visit Summary for Counseling Recommendations    Pt is UTD on health maintenance. Vaccinations NOT UTD. She declined Influenza vaccine today. Pt does NOT maintain a healthy lifestyle. Encouraged pt to exercise 30-45 minutes 4-5 times per week. Eat a well balanced diet. Avoid smoking. Limit alcohol intake. Wear seatbelt when riding in the car. Wear sun block (SPF >50) when spending extended times outside.  Increase tramadol 2 tabs every 6 hrs as needed for pain  Continue other medications as ordered  Will call with GI referral and lab results  Smoking cessation discussed and highly urged  Follow up in 6 mos for routine visit  Monica S. Perlie Gold  [facility] [address] [place], [place] [address] [phone] Cell (Monday-Friday 8 AM - 5 PM) [phone] After 5 PM and follow prompts

## 2014-10-07 NOTE — Patient Instructions (Signed)
Encouraged her to exercise 30-45 minutes 4-5 times per week. Eat a well balanced diet. Avoid smoking. Limit alcohol intake. Wear seatbelt when riding in the car. Wear sun block (SPF >50) when spending extended times outside.  Increase tramadol 2 tabs every 6 hrs as needed for pain  Continue other medications as ordered  Will call with GI referral and lab results  Follow up in 6 mos for routine visit

## 2014-10-08 ENCOUNTER — Telehealth: Payer: Self-pay | Admitting: *Deleted

## 2014-10-08 NOTE — Telephone Encounter (Addendum)
Dr. Charlsie Merles reviewed pt's fungal culture results of 09/16/2014 as negative, order urea cream if pt wants to try.  Left message for pt to call for results.  Pt called for results.  I informed pt, Dr. Charlsie Merles had recommended a Urea cream for the toenail and we sold [id number] here for $22.00.  Pt states she didn't get to talk to Dr. Charlsie Merles but for 5 minutes if that much and he didn't address the tingling in her feet, just said she had plantar fasciitis sold her some inserts she couldn't use and left.  I told her I would inform Dr. Charlsie Merles and call with information.

## 2014-10-09 ENCOUNTER — Other Ambulatory Visit: Payer: Self-pay

## 2014-10-22 ENCOUNTER — Other Ambulatory Visit: Payer: No Typology Code available for payment source

## 2014-10-22 DIAGNOSIS — D509 Iron deficiency anemia, unspecified: Secondary | ICD-10-CM

## 2014-10-22 DIAGNOSIS — D72819 Decreased white blood cell count, unspecified: Secondary | ICD-10-CM

## 2014-10-23 LAB — CBC WITH DIFFERENTIAL/PLATELET
BASOS ABS: 0 10*3/uL (ref 0.0–0.2)
BASOS: 0 %
EOS (ABSOLUTE): 0 10*3/uL (ref 0.0–0.4)
Eos: 1 %
HEMOGLOBIN: 14.5 g/dL (ref 11.1–15.9)
Hematocrit: 42.2 % (ref 34.0–46.6)
IMMATURE GRANS (ABS): 0 10*3/uL (ref 0.0–0.1)
IMMATURE GRANULOCYTES: 0 %
LYMPHS: 39 %
Lymphocytes Absolute: 1.2 10*3/uL (ref 0.7–3.1)
MCH: 32.7 pg (ref 26.6–33.0)
MCHC: 34.4 g/dL (ref 31.5–35.7)
MCV: 95 fL (ref 79–97)
MONOCYTES: 10 %
Monocytes Absolute: 0.3 10*3/uL (ref 0.1–0.9)
NEUTROS ABS: 1.6 10*3/uL (ref 1.4–7.0)
NEUTROS PCT: 50 %
PLATELETS: 363 10*3/uL (ref 150–379)
RBC: 4.43 x10E6/uL (ref 3.77–5.28)
RDW: 12.9 % (ref 12.3–15.4)
WBC: 3.2 10*3/uL — ABNORMAL LOW (ref 3.4–10.8)

## 2014-11-23 ENCOUNTER — Encounter: Payer: Self-pay | Admitting: Podiatry

## 2014-12-16 ENCOUNTER — Ambulatory Visit: Payer: No Typology Code available for payment source | Admitting: Podiatry

## 2015-02-17 ENCOUNTER — Ambulatory Visit (INDEPENDENT_AMBULATORY_CARE_PROVIDER_SITE_OTHER): Payer: BLUE CROSS/BLUE SHIELD | Admitting: Internal Medicine

## 2015-02-17 ENCOUNTER — Encounter: Payer: Self-pay | Admitting: Internal Medicine

## 2015-02-17 VITALS — BP 120/86 | HR 84 | Temp 98.6°F | Resp 20 | Ht 62.0 in | Wt 160.4 lb

## 2015-02-17 DIAGNOSIS — Z720 Tobacco use: Secondary | ICD-10-CM | POA: Diagnosis not present

## 2015-02-17 DIAGNOSIS — J209 Acute bronchitis, unspecified: Secondary | ICD-10-CM | POA: Diagnosis not present

## 2015-02-17 DIAGNOSIS — M2550 Pain in unspecified joint: Secondary | ICD-10-CM | POA: Diagnosis not present

## 2015-02-17 MED ORDER — PREDNISONE 10 MG PO TABS: ORAL_TABLET | ORAL | Status: DC

## 2015-02-17 MED ORDER — METHYLPREDNISOLONE ACETATE 40 MG/ML IJ SUSP
40.0000 mg | Freq: Once | INTRAMUSCULAR | Status: AC
  Administered 2015-02-17: 40 mg via INTRAMUSCULAR

## 2015-02-17 MED ORDER — BENZONATATE 100 MG PO CAPS: 200.0000 mg | ORAL_CAPSULE | Freq: Two times a day (BID) | ORAL | Status: DC | PRN

## 2015-02-17 MED ORDER — AZITHROMYCIN 250 MG PO TABS: ORAL_TABLET | ORAL | Status: DC

## 2015-02-17 MED ORDER — ALBUTEROL SULFATE HFA 108 (90 BASE) MCG/ACT IN AERS: INHALATION_SPRAY | RESPIRATORY_TRACT | Status: DC

## 2015-02-17 MED ORDER — METHYLPREDNISOLONE ACETATE 40 MG/ML IJ SUSP: 40.0000 mg | Freq: Once | INTRAMUSCULAR | Status: DC

## 2015-02-17 MED ORDER — TRAMADOL HCL 50 MG PO TABS: 50.0000 mg | ORAL_TABLET | Freq: Four times a day (QID) | ORAL | Status: DC | PRN

## 2015-02-17 NOTE — Progress Notes (Signed)
Patient ID: Lauren Ruiz, female   DOB: 1960-04-01, 55 y.o.   MRN: [id number]    Location:    [facility]   Place of Service:  OFFICE   Chief Complaint  Patient presents with  . Acute Visit    HPI:  55 yo female seen today for cough. She c/o cough, productive x 2 weeks. Tried delsym and was helpful but caused her to feel sedated. She reports sore throat,pots nasal drip, chills, ear pain, nasal congestion, dizziness, HA, chest pain and tightness, SOB, reduced appetite, interruption sleep, hoarseness. No sick contacts. No fever. She is still smoking cigs  Tramadol helps joint pain and she requests new rx Past Medical History  Diagnosis Date  . Anemia   . Anemia     Past Surgical History  Procedure Laterality Date  . Abdominal hysterectomy  1990  . Colon surgery  2005    [facility]    Patient Care Team: Kirt Boys, DO as PCP - General (Internal Medicine) Lenn Sink, DPM as Consulting Physician (Podiatry)  Social History   Social History  . Marital Status: Divorced    Spouse Name: N/A  . Number of Children: N/A  . Years of Education: N/A   Occupational History  . Not on file.   Social History Main Topics  . Smoking status: Current Every Day Smoker -- 32 years    Types: Cigarettes  . Smokeless tobacco: Never Used  . Alcohol Use: 0.0 oz/week    0 Standard drinks or equivalent per week  . Drug Use: Yes    Special: Marijuana  . Sexual Activity: Not on file   Other Topics Concern  . Not on file   Social History Narrative   Diet: No specfic   Do you drink/eat things with caffeine? yes   Marital status: Divorced                             What year were you married?1988                                      Do you live in a house, apartment, assisted living, condo, trailer, etc)? House   Is it one or more stories? No   How many persons live in your home? 2   Do you have any pets in your home? No   Current or past profession: Investment banker, operational, Clinical biochemist Rep. Tax  Preparer   Do you exercise? No                                               Type & how often: No   Do you have a living will? No   Do you have a DNR Form? No   Do you have a POA/HPOA forms? No        reports that she has been smoking Cigarettes.  She has smoked for the past 32 years. She has never used smokeless tobacco. She reports that she drinks alcohol. She reports that she uses illicit drugs (Marijuana).  Allergies  Allergen Reactions  . Acetaminophen Other (See Comments)    ulcers    Medications: Patient's Medications  New Prescriptions   No medications on file  Previous Medications  FERROUS GLUCONATE (FERGON) 325 MG TABLET    Take 325 mg by mouth daily with breakfast.     FERROUS SULFATE DRIED (FEOSOL) 200 (65 FE) MG TABS    Take 1 tablet by mouth daily.   MAGNESIUM GLUCONATE (MAGONATE) 500 MG TABLET    Take 500 mg by mouth daily.   MULTIPLE VITAMINS-MINERALS (CENTRUM SILVER ULTRA WOMENS) TABS    Take 1 tablet by mouth daily.   NAPHAZOLINE-PHENIRAMINE (NAPHCON-A) 0.025-0.3 % OPHTHALMIC SOLUTION    Place 1 drop into both eyes 4 (four) times daily as needed. For dry eyes    RANITIDINE (ZANTAC) 150 MG TABLET    Take 150 mg by mouth 2 (two) times daily.   TERBINAFINE (LAMISIL) 250 MG TABLET    Take 1 tablet (250 mg total) by mouth daily.   TRAMADOL (ULTRAM) 50 MG TABLET    Take 1 tablet (50 mg total) by mouth every 6 (six) hours as needed for moderate pain.  Modified Medications   No medications on file  Discontinued Medications   No medications on file    Review of Systems  Constitutional: Positive for chills, appetite change and fatigue.  HENT: Positive for congestion, ear pain, postnasal drip, rhinorrhea, sinus pressure, sore throat and voice change.   Respiratory: Positive for shortness of breath.   Cardiovascular: Positive for chest pain.  Musculoskeletal: Positive for back pain.  Neurological: Positive for dizziness and headaches.  Psychiatric/Behavioral: Positive  for sleep disturbance.  All other systems reviewed and are negative.   Filed Vitals:   02/17/15 1607  BP: 120/86  Pulse: 84  Temp: 98.6 F (37 C)  TempSrc: Oral  Resp: 20  Height: 5\' 2"  (1.575 m)  Weight: 160 lb 6.4 oz (72.757 kg)  SpO2: 98%   Body mass index is 29.33 kg/(m^2).  Physical Exam  Constitutional: She is oriented to person, place, and time. She appears well-developed and well-nourished.  No conversational dyspnea  HENT:  Mouth/Throat: Oropharynx is clear and moist. No oropharyngeal exudate.  TMs intact b/l. No redness or bulging. No sinus TTP. Nares with enlarged grey dry turbinates.  Eyes: Pupils are equal, round, and reactive to light. No scleral icterus.  Neck: Neck supple. Carotid bruit is not present. No tracheal deviation present. No thyromegaly present.  Cardiovascular: Normal rate, regular rhythm, normal heart sounds and intact distal pulses.  Exam reveals no gallop and no friction rub.   No murmur heard. No LE edema b/l. no calf TTP.   Pulmonary/Chest: Effort normal. No stridor. No respiratory distress. She has wheezes (end expiratory b/l with prolonged expiratory phase). She has no rales.  Abdominal: Soft. Bowel sounds are normal. She exhibits no distension and no mass. There is no hepatomegaly. There is no tenderness. There is no rebound and no guarding.  Musculoskeletal: She exhibits edema.  Lymphadenopathy:    She has no cervical adenopathy.  Neurological: She is alert and oriented to person, place, and time.  Skin: Skin is warm and dry. No rash noted.  Psychiatric: She has a normal mood and affect. Her behavior is normal. Judgment and thought content normal.     Labs reviewed: No visits with results within 3 Month(s) from this visit. Latest known visit with results is:  Appointment on 10/22/2014  Component Date Value Ref Range Status  . WBC 10/22/2014 3.2* 3.4 - 10.8 x10E3/uL Final  . RBC 10/22/2014 4.43  3.77 - 5.28 x10E6/uL Final  .  Hemoglobin 10/22/2014 14.5  11.1 - 15.9 g/dL Final  .  Hematocrit 10/22/2014 42.2  34.0 - 46.6 % Final  . MCV 10/22/2014 95  79 - 97 fL Final  . MCH 10/22/2014 32.7  26.6 - 33.0 pg Final  . MCHC 10/22/2014 34.4  31.5 - 35.7 g/dL Final  . RDW 78/29/5621 12.9  12.3 - 15.4 % Final  . Platelets 10/22/2014 363  150 - 379 x10E3/uL Final  . Neutrophils 10/22/2014 50   Final  . Lymphs 10/22/2014 39   Final  . Monocytes 10/22/2014 10   Final  . Eos 10/22/2014 1   Final  . Basos 10/22/2014 0   Final  . Neutrophils Absolute 10/22/2014 1.6  1.4 - 7.0 x10E3/uL Final  . Lymphocytes Absolute 10/22/2014 1.2  0.7 - 3.1 x10E3/uL Final  . Monocytes Absolute 10/22/2014 0.3  0.1 - 0.9 x10E3/uL Final  . EOS (ABSOLUTE) 10/22/2014 0.0  0.0 - 0.4 x10E3/uL Final  . Basophils Absolute 10/22/2014 0.0  0.0 - 0.2 x10E3/uL Final  . Immature Granulocytes 10/22/2014 0   Final  . Immature Grans (Abs) 10/22/2014 0.0  0.0 - 0.1 x10E3/uL Final    No results found.   Assessment/Plan   ICD-9-CM ICD-10-CM   1. Acute bronchitis, unspecified organism 466.0 J20.9 azithromycin (ZITHROMAX) 250 MG tablet     albuterol (PROVENTIL HFA;VENTOLIN HFA) 108 (90 Base) MCG/ACT inhaler     benzonatate (TESSALON) 100 MG capsule     predniSONE (DELTASONE) 10 MG tablet     methylPREDNISolone acetate (DEPO-MEDROL) injection 40 mg     DISCONTINUED: methylPREDNISolone acetate (DEPO-MEDROL) 40 MG/ML injection  2. Continuous tobacco abuse 305.1 Z72.0   3. Pain, joint, multiple sites 719.49 M25.50 traMADol (ULTRAM) 50 MG tablet   Push fluids and rest  STOP SMOKING  Start prednisone tabs tomorrow. Given injection of depo-medrol today  Start Zpak today  Use inhaler 2 puffs 3 times daily x 7 days then 2 times daily x 7 days then 1 time daily x 7 days and stop  Tessalon perles as needed for cough  Return to work 02/22/15  Follow up as scheduled and as needed  Vining S. Ancil Linsey  [facility] [address] [place], [place] [address] [phone] Cell (Monday-Friday 8 AM - 5 PM) [phone] After 5 PM and follow prompts

## 2015-02-17 NOTE — Patient Instructions (Signed)
Push fluids and rest  STOP SMOKING  Start prednisone tabs tomorrow. You were given injection of depo-medrol today  Start Zpak today  Use inhaler 2 puffs 3 times daily x 7 days then 2 times daily x 7 days then 1 time daily x 7 days and stop  Tessalon perles as needed for cough  Follow up as scheduled or as needed

## 2015-04-07 ENCOUNTER — Ambulatory Visit: Payer: No Typology Code available for payment source | Admitting: Internal Medicine

## 2015-04-07 ENCOUNTER — Encounter: Payer: Self-pay | Admitting: Internal Medicine

## 2015-09-09 ENCOUNTER — Encounter: Payer: Self-pay | Admitting: Internal Medicine

## 2015-09-09 ENCOUNTER — Ambulatory Visit: Payer: BLUE CROSS/BLUE SHIELD | Admitting: Internal Medicine

## 2015-09-09 ENCOUNTER — Ambulatory Visit (INDEPENDENT_AMBULATORY_CARE_PROVIDER_SITE_OTHER): Payer: BLUE CROSS/BLUE SHIELD | Admitting: Internal Medicine

## 2015-09-09 ENCOUNTER — Ambulatory Visit
Admission: RE | Admit: 2015-09-09 | Discharge: 2015-09-09 | Disposition: A | Discharge status: Home / Self Care | Payer: BLUE CROSS/BLUE SHIELD | Source: Ambulatory Visit | Attending: Internal Medicine | Admitting: Internal Medicine

## 2015-09-09 VITALS — BP 160/80 | HR 86 | Temp 98.8°F | Wt 164.0 lb

## 2015-09-09 DIAGNOSIS — M5441 Lumbago with sciatica, right side: Secondary | ICD-10-CM

## 2015-09-09 DIAGNOSIS — M25511 Pain in right shoulder: Secondary | ICD-10-CM | POA: Diagnosis not present

## 2015-09-09 NOTE — Progress Notes (Signed)
[facility] Provider: Ladarren Steiner L. Renato Gailseed, D.O., C.M.D. PCP:  Dr. Montez Moritaarter  Code Status: full code Goals of Care:  Advanced Directives 02/17/2015  Does patient have an advance directive? No  Would patient like information on creating an advanced directive? No - patient declined information     Chief Complaint  Patient presents with  . Acute Visit    pain in hips and legs    HPI: Patient is a 55 y.o. female seen today for an acute visit for concerns that she may have peripheral arterial disease b/c of pain in her hips and legs.  Historically, she's had plantar fasciitis and anemia.    Hurts in her right lower back and buttock and into her right shoulder.  Can hardly get up and hit the floor.  Right sided back pain goes down her leg into her foot.  Months to a year of pain.  Now every single day.  Uses massager, hot water bottles, tramadol no longer effective, but does not use every day and does not want addictive things.  Doesn't like side effective.  Has been standing up for years.    Past Medical History:  Diagnosis Date  . Anemia   . Anemia     Past Surgical History:  Procedure Laterality Date  . ABDOMINAL HYSTERECTOMY  1990  . COLON SURGERY  2005   [facility]    Allergies  Allergen Reactions  . Acetaminophen Other (See Comments)    ulcers      Medication List       Accurate as of 09/09/15  3:24 PM. Always use your most recent med list.          CENTRUM SILVER ULTRA WOMENS Tabs Take 1 tablet by mouth daily.   ferrous gluconate 325 MG tablet Commonly known as:  FERGON Take 325 mg by mouth daily with breakfast.   magnesium gluconate 500 MG tablet Commonly known as:  MAGONATE Take 500 mg by mouth daily.   naphazoline-pheniramine 0.025-0.3 % ophthalmic solution Commonly known as:  NAPHCON-A Place 1 drop into both eyes 4 (four) times daily as needed. For dry eyes   ranitidine 150 MG tablet Commonly known as:  ZANTAC Take 150 mg by mouth 2  (two) times daily.       Review of Systems:  Review of Systems  Constitutional: Negative for chills, fever and malaise/fatigue.  Respiratory: Negative for shortness of breath.   Cardiovascular: Negative for chest pain.  Gastrointestinal: Negative for abdominal pain.  Genitourinary: Negative for dysuria, flank pain, frequency, hematuria and urgency.  Musculoskeletal: Positive for back pain, myalgias and neck pain. Negative for falls and joint pain.       Neuropathic pain down right leg to foot; right plantar fasciitis  Skin: Negative for itching and rash.  Neurological: Negative for weakness.    Health Maintenance  Topic Date Due  . Hepatitis C Screening  [birth year]  . HIV Screening  09/20/1976  . TETANUS/TDAP  09/20/1980  . COLONOSCOPY  09/21/2011  . INFLUENZA VACCINE  08/17/2015  . MAMMOGRAM  09/27/2016  . PAP SMEAR  03/03/2018    Physical Exam: Vitals:   09/09/15 1517  BP: (!) 160/80  Pulse: 86  Temp: 98.8 F (37.1 C)  TempSrc: Oral  SpO2: 98%  Weight: 164 lb (74.4 kg)   Body mass index is 30 kg/m. Physical Exam  Constitutional: She is oriented to person, place, and time. She appears well-developed and well-nourished. No distress.  Musculoskeletal: Normal range  of motion. She exhibits no tenderness.  Of neck; also tender over right upper thoracic region  Neurological: She is alert and oriented to person, place, and time. She has normal reflexes. She displays normal reflexes. No cranial nerve deficit. She exhibits normal muscle tone. Coordination normal.  Right sided lower lumbar pain, SI tender and increased radiation down right leg with straight leg raise  Skin: Skin is warm and dry.    Labs reviewed: Basic Metabolic Panel: No results for input(s): NA, K, CL, CO2, GLUCOSE, BUN, CREATININE, CALCIUM, MG, PHOS, TSH in the last 8760 hours. Liver Function Tests: No results for input(s): AST, ALT, ALKPHOS, BILITOT, PROT, ALBUMIN in the last 8760 hours. No results  for input(s): LIPASE, AMYLASE in the last 8760 hours. No results for input(s): AMMONIA in the last 8760 hours. CBC:  Recent Labs  10/22/14 1123  WBC 3.2*  NEUTROABS 1.6  HCT 42.2  MCV 95  PLT 363   Lipid Panel: No results for input(s): CHOL, HDL, LDLCALC, TRIG, CHOLHDL, LDLDIRECT in the last 8760 hours. Lab Results  Component Value Date   HGBA1C 5.4 09/02/2014    Assessment/Plan 1. Right-sided low back pain with right-sided sciatica -advised to get xrays of lumbar spine first - DG Lumbar Spine Complete; Future - Ambulatory referral to Physical Therapy--she is unsure if she'll be able to afford this--depends on insurance coverage--says she can do a copay though  2. Right shoulder pain - suspect some cervical and thoracic somatic dysfunction also - Ambulatory referral to Physical Therapy  -if unable to do PT or not getting benefit, I do think she needs to return to Dr. Montez Moritaarter possibly for OMM treatment which is more likely to help her than medications and she does not want meds  Labs/tests ordered:   Orders Placed This Encounter  Procedures  . DG Lumbar Spine Complete    Standing Status:   Future    Number of Occurrences:   1    Standing Expiration Date:   11/08/2016    Order Specific Question:   Reason for Exam (SYMPTOM  OR DIAGNOSIS REQUIRED)    Answer:   right low back pain radiating down leg    Order Specific Question:   Is patient pregnant?    Answer:   No    Order Specific Question:   Preferred imaging location?    Answer:   [id number]  . Ambulatory referral to Physical Therapy    Referral Priority:   Routine    Referral Type:   Physical Medicine    Referral Reason:   Specialty Services Required    Requested Specialty:   Physical Therapy    Number of Visits Requested:   1   Next appt:  F/u with Dr. Montez Moritaarter for routine visit and prn   Azazel Franze L. Leeann Bady, D.O. Geriatrics [facility] [address] [place], [place]  [address] Cell Phone (Mon-Fri 8am-5pm):  [phone] On Call:  [phone] & follow prompts after 5pm & weekends Office Phone:  [phone] Office Fax:  [phone]

## 2015-09-09 NOTE — Patient Instructions (Signed)
Let's check an xray of your lower back. Continue doing heat and stretching, massage of your back and shoulder. I recommend you see PT for further treatment.  I put in a referral.

## 2015-09-10 ENCOUNTER — Other Ambulatory Visit: Payer: Self-pay | Admitting: Internal Medicine

## 2015-09-10 DIAGNOSIS — M25511 Pain in right shoulder: Secondary | ICD-10-CM | POA: Insufficient documentation

## 2015-09-10 DIAGNOSIS — D509 Iron deficiency anemia, unspecified: Secondary | ICD-10-CM | POA: Insufficient documentation

## 2015-09-10 DIAGNOSIS — M5441 Lumbago with sciatica, right side: Secondary | ICD-10-CM | POA: Insufficient documentation

## 2015-09-10 DIAGNOSIS — Z720 Tobacco use: Secondary | ICD-10-CM | POA: Insufficient documentation

## 2015-09-10 NOTE — Progress Notes (Signed)
Referral for MRI due to right sided low back pain with sciatica.

## 2015-09-13 ENCOUNTER — Ambulatory Visit: Payer: BLUE CROSS/BLUE SHIELD | Admitting: Nurse Practitioner

## 2015-10-04 ENCOUNTER — Other Ambulatory Visit: Payer: BLUE CROSS/BLUE SHIELD

## 2015-10-13 ENCOUNTER — Encounter: Payer: Self-pay | Admitting: Internal Medicine

## 2015-10-13 ENCOUNTER — Ambulatory Visit (INDEPENDENT_AMBULATORY_CARE_PROVIDER_SITE_OTHER): Payer: BLUE CROSS/BLUE SHIELD | Admitting: Internal Medicine

## 2015-10-13 VITALS — BP 138/88 | HR 89 | Temp 98.4°F | Ht 62.0 in | Wt 162.0 lb

## 2015-10-13 DIAGNOSIS — M62838 Other muscle spasm: Secondary | ICD-10-CM

## 2015-10-13 DIAGNOSIS — Z1211 Encounter for screening for malignant neoplasm of colon: Secondary | ICD-10-CM

## 2015-10-13 DIAGNOSIS — M79674 Pain in right toe(s): Secondary | ICD-10-CM | POA: Diagnosis not present

## 2015-10-13 DIAGNOSIS — M5441 Lumbago with sciatica, right side: Secondary | ICD-10-CM | POA: Diagnosis not present

## 2015-10-13 MED ORDER — OXYCODONE HCL 5 MG PO CAPS
5.0000 mg | ORAL_CAPSULE | ORAL | 0 refills | Status: DC | PRN
Start: 2015-10-13 — End: 2016-02-05

## 2015-10-13 MED ORDER — TIZANIDINE HCL 4 MG PO TABS: 4.0000 mg | ORAL_TABLET | Freq: Four times a day (QID) | ORAL | 1 refills | Status: DC | PRN

## 2015-10-13 NOTE — Progress Notes (Signed)
Patient ID: Lauren Ruiz, female   DOB: July 22, 1960, 55 y.o.   MRN: [id number]    Location:  [facility] Place of Service: OFFICE  Chief Complaint  Patient presents with  . Leg Pain    right leg and hip pain, pain is getting worse  . Flu Vaccine    refused    HPI:  55 yo female seen today for right leg and hip pain x 2 yrs. She reports pain 10/10 on scale radiates into RLE with associated burning sensation into right leg and 1st toe. She is unable to sleep on her right side due to the pain. No falls. She takes tramadol but no relief. Pain interrupts sleep and affects ADLs (dressing, grooming, moving room-to-room) She bought a TENS unit but has not tried it yet. She was seen by Dr Renato Gails last month for same pain. xrays of L spine were neg for fx or other acute process. No degenerative changes. It was recommended she have MRI but she states copay is >$800.  She has pain in right 1st toe x 2 yrs. She has seen podiatry in the past - toenail clipping neg; has tried antifungal nail polish, inserts and antifungal pills without relief. She is unable to wear most closed toe shoes and some sandals. She wears her work shoes which has plantar fascia insert that helps. Her pain is worse at night when trying to rest  Past Medical History:  Diagnosis Date  . Anemia   . Anemia     Past Surgical History:  Procedure Laterality Date  . ABDOMINAL HYSTERECTOMY  1990  . COLON SURGERY  2005   [facility]    Patient Care Team: Kirt Boys, DO as PCP - General (Internal Medicine) Lenn Sink, DPM as Consulting Physician (Podiatry)  Social History   Social History  . Marital status: Divorced    Spouse name: N/A  . Number of children: N/A  . Years of education: N/A   Occupational History  . Not on file.   Social History Main Topics  . Smoking status: Current Every Day Smoker    Years: 32.00    Types: Cigarettes  . Smokeless tobacco: Never Used  . Alcohol use 0.0 oz/week  . Drug use:    Types: Marijuana  . Sexual activity: Not on file   Other Topics Concern  . Not on file   Social History Narrative   Diet: No specfic   Do you drink/eat things with caffeine? yes   Marital status: Divorced                             What year were you married?1988                                      Do you live in a house, apartment, assisted living, condo, trailer, etc)? House   Is it one or more stories? No   How many persons live in your home? 2   Do you have any pets in your home? No   Current or past profession: Investment banker, operational, Clinical biochemist Rep. Tax Preparer   Do you exercise? No  Type & how often: No   Do you have a living will? No   Do you have a DNR Form? No   Do you have a POA/HPOA forms? No        reports that she has been smoking Cigarettes.  She has smoked for the past 32.00 years. She has never used smokeless tobacco. She reports that she drinks alcohol. She reports that she uses drugs, including Marijuana.  History reviewed. No pertinent family history. No family status information on file.     Allergies  Allergen Reactions  . Acetaminophen Other (See Comments)    ulcers    Medications: Patient's Medications  New Prescriptions   No medications on file  Previous Medications   FERROUS GLUCONATE (FERGON) 325 MG TABLET    Take 325 mg by mouth daily with breakfast.     MAGNESIUM GLUCONATE (MAGONATE) 500 MG TABLET    Take 500 mg by mouth daily.   MULTIPLE VITAMINS-MINERALS (CENTRUM SILVER ULTRA WOMENS) TABS    Take 1 tablet by mouth daily.   NAPHAZOLINE-PHENIRAMINE (NAPHCON-A) 0.025-0.3 % OPHTHALMIC SOLUTION    Place 1 drop into both eyes 4 (four) times daily as needed. For dry eyes    RANITIDINE (ZANTAC) 150 MG TABLET    Take 150 mg by mouth 2 (two) times daily.  Modified Medications   No medications on file  Discontinued Medications   No medications on file    Review of Systems  Constitutional: Positive for activity  change (she has reduced outside activities due to her pain including care of her lawn).  Musculoskeletal: Positive for back pain, gait problem and joint swelling.  Psychiatric/Behavioral: Positive for sleep disturbance.  All other systems reviewed and are negative.   Vitals:   10/13/15 1457  BP: 138/88  Pulse: 89  Temp: 98.4 F (36.9 C)  TempSrc: Oral  SpO2: 97%  Weight: 162 lb (73.5 kg)  Height: 5\' 2"  (1.575 m)   Body mass index is 29.63 kg/m.  Physical Exam  Constitutional: She is oriented to person, place, and time. She appears well-developed and well-nourished.    Looks uncomfortable in NAD  Musculoskeletal: She exhibits edema and tenderness.  Right 1st toe PIP joint swelling. Right ankle joint with swelling, TTP and reduced ROM; (+) right standing flexion test; right pelvic out flare; right anterior innominate;  Sacral torsion; right posterior proximal fibula head with TTP; left short leg; paravertebral lumbar muscle hypertrophy and ropy tissue texture changes; gait antalgic  Neurological: She is alert and oriented to person, place, and time.  Skin: Skin is warm and dry. No rash noted.  Right 1st toenail TTP with discoloration and very thick, TTP at all edges but not too loose. Dystrophic appearing and swelling noted at nail bed. Toe is swollen with TTP at PIP joint. No d/c. No redness.   Psychiatric: She has a normal mood and affect. Her behavior is normal. Judgment and thought content normal.     Labs reviewed: No visits with results within 3 Month(s) from this visit.  Latest known visit with results is:  Appointment on 10/22/2014  Component Date Value Ref Range Status  . WBC 10/23/2014 3.2* 3.4 - 10.8 x10E3/uL Final  . RBC 10/23/2014 4.43  3.77 - 5.28 x10E6/uL Final  . Hemoglobin 10/23/2014 14.5  11.1 - 15.9 g/dL Final  . Hematocrit 16/10/9602 42.2  34.0 - 46.6 % Final  . MCV 10/23/2014 95  79 - 97 fL Final  . MCH 10/23/2014 32.7  26.6 -  33.0 pg Final  . MCHC  10/23/2014 34.4  31.5 - 35.7 g/dL Final  . RDW 31/54/008610/07/2014 12.9  12.3 - 15.4 % Final  . Platelets 10/23/2014 363  150 - 379 x10E3/uL Final  . Neutrophils 10/23/2014 50  % Final  . Lymphs 10/23/2014 39  % Final  . Monocytes 10/23/2014 10  % Final  . Eos 10/23/2014 1  % Final  . Basos 10/23/2014 0  % Final  . Neutrophils Absolute 10/23/2014 1.6  1.4 - 7.0 x10E3/uL Final  . Lymphocytes Absolute 10/23/2014 1.2  0.7 - 3.1 x10E3/uL Final  . Monocytes Absolute 10/23/2014 0.3  0.1 - 0.9 x10E3/uL Final  . EOS (ABSOLUTE) 10/23/2014 0.0  0.0 - 0.4 x10E3/uL Final  . Basophils Absolute 10/23/2014 0.0  0.0 - 0.2 x10E3/uL Final  . Immature Granulocytes 10/23/2014 0  % Final  . Immature Grans (Abs) 10/23/2014 0.0  0.0 - 0.1 x10E3/uL Final    No results found.   Assessment/Plan   ICD-9-CM ICD-10-CM   1. Toe pain, right 729.5 M79.674 oxycodone (OXY-IR) 5 MG capsule     Ambulatory referral to Podiatry  2. Right-sided low back pain with right-sided sciatica 724.3 M54.41 tiZANidine (ZANAFLEX) 4 MG tablet     oxycodone (OXY-IR) 5 MG capsule  3. Muscle spasm of right lower extremity 728.85 M62.838 tiZANidine (ZANAFLEX) 4 MG tablet  4. Colon cancer screening V76.51 Z12.11 Ambulatory referral to Gastroenterology   Follow up as scheduled  Discussed oxycodone use - may cause sleepiness and potential addiction. DO NOT DRIVE OR OPERATE HEAVY MACHINERY WHILE TAKING MED  Lauren Fiorenza S. Ancil Linseyarter, D. O., F. A. C. O. I.  [facility] [address] [place], [place] [address] [phone] Cell (Monday-Friday 8 AM - 5 PM) [phone] After 5 PM and follow prompts

## 2015-10-13 NOTE — Patient Instructions (Signed)
Will call with referral appts  Follow up as scheduled  Discussed oxycodone use - may cause sleepiness and potential addiction. DO NOT DRIVE OR OPERATE HEAVY MACHINERY WHILE TAKING MED

## 2015-10-26 DIAGNOSIS — B351 Tinea unguium: Secondary | ICD-10-CM | POA: Insufficient documentation

## 2015-11-09 ENCOUNTER — Encounter: Payer: Self-pay | Admitting: Internal Medicine

## 2016-02-04 ENCOUNTER — Emergency Department (HOSPITAL_COMMUNITY): Payer: BLUE CROSS/BLUE SHIELD

## 2016-02-04 ENCOUNTER — Encounter (HOSPITAL_COMMUNITY): Payer: Self-pay | Admitting: Emergency Medicine

## 2016-02-04 DIAGNOSIS — Y999 Unspecified external cause status: Secondary | ICD-10-CM | POA: Insufficient documentation

## 2016-02-04 DIAGNOSIS — F17210 Nicotine dependence, cigarettes, uncomplicated: Secondary | ICD-10-CM | POA: Insufficient documentation

## 2016-02-04 DIAGNOSIS — S8991XA Unspecified injury of right lower leg, initial encounter: Secondary | ICD-10-CM | POA: Insufficient documentation

## 2016-02-04 DIAGNOSIS — Y939 Activity, unspecified: Secondary | ICD-10-CM | POA: Insufficient documentation

## 2016-02-04 DIAGNOSIS — Y929 Unspecified place or not applicable: Secondary | ICD-10-CM | POA: Insufficient documentation

## 2016-02-04 DIAGNOSIS — W000XXA Fall on same level due to ice and snow, initial encounter: Secondary | ICD-10-CM | POA: Insufficient documentation

## 2016-02-04 DIAGNOSIS — S59901A Unspecified injury of right elbow, initial encounter: Secondary | ICD-10-CM | POA: Insufficient documentation

## 2016-02-04 DIAGNOSIS — Z79899 Other long term (current) drug therapy: Secondary | ICD-10-CM | POA: Insufficient documentation

## 2016-02-04 NOTE — ED Triage Notes (Signed)
Pt states she fell on ice today getting to her house from work and hit her right arm and right leg, c/o 9/10 pain on both extremities.

## 2016-02-05 ENCOUNTER — Emergency Department (HOSPITAL_COMMUNITY)
Admission: EM | Admit: 2016-02-05 | Discharge: 2016-02-05 | Disposition: A | Discharge status: Home / Self Care | Payer: BLUE CROSS/BLUE SHIELD | Attending: Emergency Medicine | Admitting: Emergency Medicine

## 2016-02-05 DIAGNOSIS — S8991XA Unspecified injury of right lower leg, initial encounter: Secondary | ICD-10-CM

## 2016-02-05 DIAGNOSIS — W19XXXA Unspecified fall, initial encounter: Secondary | ICD-10-CM

## 2016-02-05 DIAGNOSIS — S59901A Unspecified injury of right elbow, initial encounter: Secondary | ICD-10-CM

## 2016-02-05 MED ORDER — IBUPROFEN 800 MG PO TABS: 800.0000 mg | ORAL_TABLET | Freq: Three times a day (TID) | ORAL | 0 refills | Status: AC | PRN

## 2016-02-05 MED ORDER — TRAMADOL HCL 50 MG PO TABS: 50.0000 mg | ORAL_TABLET | Freq: Four times a day (QID) | ORAL | 0 refills | Status: DC | PRN

## 2016-02-05 NOTE — Discharge Instructions (Signed)
Return here as needed. Ice and elevate the areas that are injured. Follow up with the orthopedist provided.

## 2016-02-05 NOTE — ED Notes (Signed)
Pt assisted to restroom with RN.

## 2016-02-05 NOTE — ED Provider Notes (Signed)
[facility]-EMERGENCY DEPT Provider Note   CSN: [id number] Arrival date & time: 02/04/16  2242     History   Chief Complaint Chief Complaint  Patient presents with  . Fall    HPI Lauren Ruiz is a 56 y.o. female.  HPI Patient presents to the emergency department with injuries following a fall.  The patient states last night she slipped on ice, falling, injuring her right elbow and right knee.  The patient states that movement and palpation make the pain worse.  She states she did not take any medications prior to arrival.  Patient states that her knee feels swollenThe patient denies chest pain, shortness of breath, headache,blurred vision, neck pain, fever, cough, weakness, numbness, dizziness, anorexia, edema, abdominal pain, nausea, vomiting, diarrhea, rash, back pain, dysuria, hematemesis, bloody stool, near syncope, or syncope. Past Medical History:  Diagnosis Date  . Anemia   . Anemia     Patient Active Problem List   Diagnosis Date Noted  . Right-sided low back pain with right-sided sciatica 09/10/2015  . Right shoulder pain 09/10/2015  . Continuous tobacco abuse 09/10/2015  . Iron deficiency anemia 09/10/2015    Past Surgical History:  Procedure Laterality Date  . ABDOMINAL HYSTERECTOMY  1990  . COLON SURGERY  2005   [facility]    OB History    No data available       Home Medications    Prior to Admission medications   Medication Sig Start Date End Date Taking? Authorizing Provider  Biotin 5 MG CAPS Take 1 capsule by mouth daily.   Yes Historical Provider, MD  ferrous gluconate (FERGON) 325 MG tablet Take 325 mg by mouth daily with breakfast.     Yes Historical Provider, MD  magnesium gluconate (MAGONATE) 500 MG tablet Take 500 mg by mouth daily.   Yes Historical Provider, MD  Multiple Vitamins-Minerals (CENTRUM SILVER ULTRA WOMENS) TABS Take 1 tablet by mouth daily.   Yes Historical Provider, MD  naphazoline-pheniramine (NAPHCON-A) 0.025-0.3 %  ophthalmic solution Place 1 drop into both eyes 4 (four) times daily as needed. For dry eyes    Yes Historical Provider, MD  ranitidine (ZANTAC) 150 MG tablet Take 150 mg by mouth 2 (two) times daily as needed for heartburn.    Yes Historical Provider, MD    Family History History reviewed. No pertinent family history.  Social History Social History  Substance Use Topics  . Smoking status: Current Every Day Smoker    Years: 32.00    Types: Cigarettes  . Smokeless tobacco: Never Used  . Alcohol use 0.0 oz/week     Allergies   Acetaminophen   Review of Systems Review of Systems All other systems negative except as documented in the HPI. All pertinent positives and negatives as reviewed in the HPI.  Physical Exam Updated Vital Signs BP 147/84   Pulse 63   Temp 98.7 F (37.1 C) (Oral)   Resp 16   Ht 5\' 5"  (1.651 m)   Wt 72.6 kg   SpO2 98%   BMI 26.63 kg/m   Physical Exam  Constitutional: She is oriented to person, place, and time. She appears well-developed and well-nourished. No distress.  HENT:  Head: Normocephalic and atraumatic.  Eyes: Pupils are equal, round, and reactive to light.  Pulmonary/Chest: Effort normal.  Musculoskeletal:       Right elbow: She exhibits normal range of motion, no swelling, no effusion and no deformity. Tenderness found.       Right knee:  She exhibits decreased range of motion. She exhibits no swelling, no deformity, no erythema, normal alignment, normal patellar mobility, no bony tenderness and normal meniscus. Tenderness found. Medial joint line and lateral joint line tenderness noted.  Neurological: She is alert and oriented to person, place, and time.  Skin: Skin is warm and dry.  Psychiatric: She has a normal mood and affect.  Nursing note and vitals reviewed.    ED Treatments / Results  Labs (all labs ordered are listed, but only abnormal results are displayed) Labs Reviewed - No data to display  EKG  EKG  Interpretation None       Radiology Dg Tibia/fibula Right  Result Date: 02/05/2016 CLINICAL DATA:  Slipped and fell on ice with pain EXAM: RIGHT TIBIA AND FIBULA - 2 VIEW COMPARISON:  None. FINDINGS: There is no evidence of fracture or other focal bone lesions. Soft tissues are unremarkable. Tiny plantar calcaneal spur. IMPRESSION: Negative. Electronically Signed   By: Jasmine PangKim  Fujinaga M.D.   On: 02/05/2016 00:31   Dg Humerus Right  Result Date: 02/05/2016 CLINICAL DATA:  Slipped and fell on ice with right upper arm pain EXAM: RIGHT HUMERUS - 2+ VIEW COMPARISON:  None. FINDINGS: There is no evidence of fracture or other focal bone lesions. Soft tissues are unremarkable. IMPRESSION: Negative. Electronically Signed   By: Jasmine PangKim  Fujinaga M.D.   On: 02/05/2016 00:30    Procedures Procedures (including critical care time)  Medications Ordered in ED Medications - No data to display   Initial Impression / Assessment and Plan / ED Course  I have reviewed the triage vital signs and the nursing notes.  Pertinent labs & imaging results that were available during my care of the patient were reviewed by me and considered in my medical decision making (see chart for details).   patient will be referred to orthopedics, placed in knee immobilizer with crutches along with an Ace wrap of her elbow.  Patient is advised to ice and elevate the areas that are sore.  Told to return here as needed  Final Clinical Impressions(s) / ED Diagnoses   Final diagnoses:  Fall, initial encounter  Injury of right elbow, initial encounter  Injury of right knee, initial encounter    New Prescriptions New Prescriptions   No medications on file     Charlestine NightChristopher Mikah Rottinghaus, PA-C 02/05/16 1611    Geoffery Lyonsouglas Delo, MD 02/05/16 1626

## 2017-02-11 DIAGNOSIS — M25561 Pain in right knee: Secondary | ICD-10-CM | POA: Insufficient documentation

## 2017-02-11 DIAGNOSIS — Z79899 Other long term (current) drug therapy: Secondary | ICD-10-CM | POA: Insufficient documentation

## 2017-02-11 DIAGNOSIS — F17210 Nicotine dependence, cigarettes, uncomplicated: Secondary | ICD-10-CM | POA: Insufficient documentation

## 2017-02-12 ENCOUNTER — Other Ambulatory Visit: Payer: Self-pay

## 2017-02-12 ENCOUNTER — Emergency Department (HOSPITAL_COMMUNITY)
Admission: EM | Admit: 2017-02-12 | Discharge: 2017-02-12 | Disposition: A | Discharge status: Home / Self Care | Payer: Self-pay | Attending: Emergency Medicine | Admitting: Emergency Medicine

## 2017-02-12 ENCOUNTER — Encounter (HOSPITAL_COMMUNITY): Payer: Self-pay | Admitting: Emergency Medicine

## 2017-02-12 ENCOUNTER — Emergency Department (HOSPITAL_COMMUNITY): Payer: Self-pay

## 2017-02-12 DIAGNOSIS — M25561 Pain in right knee: Secondary | ICD-10-CM

## 2017-02-12 MED ORDER — NAPROXEN 375 MG PO TABS: 375.0000 mg | ORAL_TABLET | Freq: Two times a day (BID) | ORAL | 0 refills | Status: DC

## 2017-02-12 MED ORDER — NAPROXEN 375 MG PO TABS
375.0000 mg | ORAL_TABLET | Freq: Once | ORAL | Status: AC
  Administered 2017-02-12: 375 mg via ORAL
  Filled 2017-02-12: qty 1

## 2017-02-12 NOTE — ED Provider Notes (Signed)
[facility]-EMERGENCY DEPT Provider Note   CSN: [id number] Arrival date & time: 02/11/17  2320     History   Chief Complaint Chief Complaint  Patient presents with  . Knee Pain    HPI Lauren Ruiz is a 57 y.o. female.  Patient here for evaluation of right knee pain and swelling that started 3 days ago without trauma or injury. She denies fever, redness. The pain go to lower extremity and up to thigh. She denies previous history of the same. She has not taken anything for pain during the last 3 days because "I don't like to take medication. No left knee symptoms.    The history is provided by the patient. No language interpreter was used.    Past Medical History:  Diagnosis Date  . Anemia   . Anemia     Patient Active Problem List   Diagnosis Date Noted  . Right-sided low back pain with right-sided sciatica 09/10/2015  . Right shoulder pain 09/10/2015  . Continuous tobacco abuse 09/10/2015  . Iron deficiency anemia 09/10/2015    Past Surgical History:  Procedure Laterality Date  . ABDOMINAL HYSTERECTOMY  1990  . COLON SURGERY  2005   [facility]    OB History    No data available       Home Medications    Prior to Admission medications   Medication Sig Start Date End Date Taking? Authorizing Provider  Biotin 5 MG CAPS Take 1 capsule by mouth daily.    [provider]  ferrous gluconate (FERGON) 325 MG tablet Take 325 mg by mouth daily with breakfast.      [provider]  ibuprofen (ADVIL,MOTRIN) 800 MG tablet Take 1 tablet (800 mg total) by mouth every 8 (eight) hours as needed. 02/05/16   Lawyer, Cristal Deerhristopher, PA-C  magnesium gluconate (MAGONATE) 500 MG tablet Take 500 mg by mouth daily.    [provider]  Multiple Vitamins-Minerals (CENTRUM SILVER ULTRA WOMENS) TABS Take 1 tablet by mouth daily.    [provider]  naphazoline-pheniramine (NAPHCON-A) 0.025-0.3 % ophthalmic solution Place 1  drop into both eyes 4 (four) times daily as needed. For dry eyes     [provider]  ranitidine (ZANTAC) 150 MG tablet Take 150 mg by mouth 2 (two) times daily as needed for heartburn.     [provider]  traMADol (ULTRAM) 50 MG tablet Take 1 tablet (50 mg total) by mouth every 6 (six) hours as needed for severe pain. 02/05/16   Charlestine NightLawyer, Christopher, PA-C    Family History History reviewed. No pertinent family history.  Social History Social History   Tobacco Use  . Smoking status: Current Every Day Smoker    Years: 32.00    Types: Cigarettes  . Smokeless tobacco: Never Used  Substance Use Topics  . Alcohol use: Yes    Alcohol/week: 0.0 oz  . Drug use: Yes    Types: Marijuana     Allergies   Acetaminophen   Review of Systems Review of Systems  Constitutional: Negative for chills and fever.  Respiratory: Negative for shortness of breath.   Cardiovascular: Negative.  Negative for chest pain.  Musculoskeletal:       See HPI.  Skin: Negative.  Negative for color change.  Neurological: Negative.  Negative for weakness and numbness.     Physical Exam Updated Vital Signs BP (!) 140/96 (BP Location: Left Arm)   Pulse 74   Temp 99.4 F (37.4 C) (  Oral)   Resp 18   Ht 5\' 3"  (1.6 m)   Wt 74.8 kg (165 lb)   LMP 09/09/2014   SpO2 98%   BMI 29.23 kg/m   Physical Exam  Constitutional: She is oriented to person, place, and time. She appears well-developed and well-nourished.  Neck: Normal range of motion.  Cardiovascular: Intact distal pulses.  Pulmonary/Chest: Effort normal.  Musculoskeletal:  Right knee moderately swollen without significant effusion. No redness or warmth. FROM limited by pain. No calf or thigh tenderness or swelling.   Neurological: She is alert and oriented to person, place, and time.  Skin: Skin is warm and dry.     ED Treatments / Results  Labs (all labs ordered are listed, but only abnormal results are displayed) Labs  Reviewed - No data to display  EKG  EKG Interpretation None       Radiology Dg Knee Complete 4 Views Right  Result Date: 02/12/2017 CLINICAL DATA:  Pt reports having right knee pain with swelling that stared on Friday and has gotten worse. Pt denies any injury to knee. EXAM: RIGHT KNEE - COMPLETE 4+ VIEW COMPARISON:  None. FINDINGS: No evidence of fracture, dislocation, or joint effusion. No evidence of arthropathy or other focal bone abnormality. Soft tissues are unremarkable. IMPRESSION: Negative. Electronically Signed   By: Bary Richard M.D.   On: 02/12/2017 01:06    Procedures Procedures (including critical care time)  Medications Ordered in ED Medications - No data to display   Initial Impression / Assessment and Plan / ED Course  I have reviewed the triage vital signs and the nursing notes.  Pertinent labs & imaging results that were available during my care of the patient were reviewed by me and considered in my medical decision making (see chart for details).     Patient with acute onset painful swollen right knee. She reports no history of the same.   No evidence or suspicion of septic joint. No specific injury. Offered crutches but she states she has some at home she will use. Will place in a knee immobilizer and Rx Naproxen for pain and inflammation. Follow up with orthopedics if no better in 3-4 days.   Final Clinical Impressions(s) / ED Diagnoses   Final diagnoses:  None   1. Right knee pain  ED Discharge Orders    None       Elpidio Anis, PA-C 02/12/17 9604    Gilda Crease, MD 02/13/17 [phone]

## 2017-02-12 NOTE — ED Triage Notes (Signed)
Pt reports having right knee pain with swelling that stared on Friday and has gotten worse. Pt denies any injury to knee.

## 2017-02-12 NOTE — ED Notes (Signed)
Pt refused knee immobilizer--- pt stated, "I don't need that... I got one at home".  Pt able to state how to put on knee immobilizer step by step.

## 2018-01-21 ENCOUNTER — Encounter: Payer: Self-pay | Admitting: Nurse Practitioner

## 2018-01-21 ENCOUNTER — Ambulatory Visit (INDEPENDENT_AMBULATORY_CARE_PROVIDER_SITE_OTHER): Payer: BLUE CROSS/BLUE SHIELD | Admitting: Nurse Practitioner

## 2018-01-21 VITALS — BP 122/84 | HR 81 | Temp 98.5°F | Ht 62.0 in | Wt 171.4 lb

## 2018-01-21 DIAGNOSIS — R200 Anesthesia of skin: Secondary | ICD-10-CM | POA: Diagnosis not present

## 2018-01-21 DIAGNOSIS — M542 Cervicalgia: Secondary | ICD-10-CM | POA: Diagnosis not present

## 2018-01-21 DIAGNOSIS — G8929 Other chronic pain: Secondary | ICD-10-CM

## 2018-01-21 DIAGNOSIS — R82998 Other abnormal findings in urine: Secondary | ICD-10-CM

## 2018-01-21 DIAGNOSIS — R350 Frequency of micturition: Secondary | ICD-10-CM | POA: Diagnosis not present

## 2018-01-21 DIAGNOSIS — M5442 Lumbago with sciatica, left side: Secondary | ICD-10-CM | POA: Diagnosis not present

## 2018-01-21 DIAGNOSIS — Z13220 Encounter for screening for lipoid disorders: Secondary | ICD-10-CM

## 2018-01-21 DIAGNOSIS — R202 Paresthesia of skin: Secondary | ICD-10-CM

## 2018-01-21 LAB — POCT URINALYSIS DIPSTICK
Glucose, UA: NEGATIVE
KETONES UA: NEGATIVE
Nitrite, UA: NEGATIVE
ODOR: NORMAL
PH UA: 5 (ref 5.0–8.0)
Protein, UA: NEGATIVE
RBC UA: NEGATIVE
SPEC GRAV UA: 1.01 (ref 1.010–1.025)
UROBILINOGEN UA: 1 U/dL

## 2018-01-21 NOTE — Patient Instructions (Addendum)
To use aleve 1 tablet by mouth twice daily for 1 week Continue to use heat with muscle rub after heat To get xray of spine  Follow up in 3 months for routine follow up

## 2018-01-21 NOTE — Progress Notes (Signed)
Careteam: Patient Care Team: Patient, No Pcp Per as PCP - General (General Practice) Regal, Kirstie Peri, DPM as Consulting Physician (Podiatry)  Advanced Directive information    Allergies  Allergen Reactions  . Acetaminophen Other (See Comments)    ulcers    Chief Complaint  Patient presents with  . Medical Management of Chronic Issues    Routine Visit Pt c/o neck, back, and leg pain and frequent urination,     HPI: Patient is a 58 y.o. female seen in the office today due to multiple complaints. Ms Stremel has not been seen in ~ 2 years.   Reports over the past few (2) weeks she can not get out of bed due to pain in her left hip and pain that goes down her left leg.  In the last few days has gotten better after heat and massage.  Feels like she needs a few days a week.  Wonders if she needs to see an orthopedic over the pain.  She had been seen in 2017 by Dr reed due to right sided back pain with radiation down right leg, lumbar spine with significant abnormalities and MRI was recommended however she never went to get this due to insurance issues.  2 years ago she feel on black ice and hit right side of her body. Pain went away on this side.  No injury to back on left side of body.   Works in [employer] and stands for over 12 hours a day, tries to wear supportive shoes but goes through them frequently.   Has been 10/10 pain in the back, when she gets up and walks around it improve. Reports pain is like strain, sore, ache. Reports shooting, throbbing pain down the left leg. Does not take anything routinely but will use Ibuprofen occasionally.- used 5 times in the last 2 weeks.  Uses heating pad mostly.  Now reports pain is more sore.  6/10 now but worse at night.  No loss of bowel or bladder  Reports frequent urination- no loss of control of bladder but some leakage. Night is the worse but frequent urination throughout the day.  Had a vaginal procedure from someone at  [facility] and since then she has had more urination which was over a year ago. Occasionally pain with urination. Hold urine a lot because she has to go a lot.  Attempts to drink a lot of water- 48-64 oz a day.  No abdominal pain or discomfort.       Review of Systems:  Review of Systems  Constitutional: Negative for chills, fever and weight loss.  HENT: Negative for tinnitus.   Respiratory: Negative for cough, sputum production and shortness of breath.   Cardiovascular: Negative for chest pain, palpitations and leg swelling.  Gastrointestinal: Negative for abdominal pain, constipation, diarrhea and heartburn.  Genitourinary: Positive for frequency and urgency. Negative for dysuria.  Musculoskeletal: Positive for back pain, joint pain, myalgias and neck pain. Negative for falls.  Skin: Negative.   Neurological: Positive for tingling and sensory change. Negative for dizziness and headaches.  Endo/Heme/Allergies:       Hot flashes  Psychiatric/Behavioral: Negative for depression and memory loss. The patient has insomnia (? due to OAB).     Past Medical History:  Diagnosis Date  . Anemia   . Anemia    Past Surgical History:  Procedure Laterality Date  . ABDOMINAL HYSTERECTOMY  1990  . COLON SURGERY  2005   [facility]  Social History:   reports that she has quit smoking. Her smoking use included cigarettes. She quit after 32.00 years of use. She has never used smokeless tobacco. She reports current alcohol use. She reports current drug use. Drug: Marijuana.  History reviewed. No pertinent family history.  Medications: Patient's Medications  New Prescriptions   No medications on file  Previous Medications   BIOTIN 5 MG CAPS    Take 1 capsule by mouth daily.   FERROUS GLUCONATE (FERGON) 325 MG TABLET    Take 325 mg by mouth daily with breakfast.     IBUPROFEN (ADVIL,MOTRIN) 800 MG TABLET    Take 1 tablet (800 mg total) by mouth every 8 (eight) hours as needed.     MAGNESIUM GLUCONATE (MAGONATE) 500 MG TABLET    Take 500 mg by mouth daily.   MULTIPLE VITAMINS-MINERALS (CENTRUM SILVER ULTRA WOMENS) TABS    Take 1 tablet by mouth daily.  Modified Medications   No medications on file  Discontinued Medications   NAPHAZOLINE-PHENIRAMINE (NAPHCON-A) 0.025-0.3 % OPHTHALMIC SOLUTION    Place 1 drop into both eyes 4 (four) times daily as needed. For dry eyes    NAPROXEN (NAPROSYN) 375 MG TABLET    Take 1 tablet (375 mg total) by mouth 2 (two) times daily.   RANITIDINE (ZANTAC) 150 MG TABLET    Take 150 mg by mouth 2 (two) times daily as needed for heartburn.    TRAMADOL (ULTRAM) 50 MG TABLET    Take 1 tablet (50 mg total) by mouth every 6 (six) hours as needed for severe pain.     Physical Exam:  Vitals:   01/21/18 1019  BP: 122/84  Pulse: 81  Temp: 98.5 F (36.9 C)  TempSrc: Oral  SpO2: 97%  Weight: 171 lb 6.4 oz (77.7 kg)  Height: 5\' 2"  (1.575 m)   Body mass index is 31.35 kg/m.  Physical Exam Constitutional:      General: She is not in acute distress.    Appearance: She is well-developed. She is not diaphoretic.  HENT:     Head: Normocephalic and atraumatic.     Mouth/Throat:     Pharynx: No oropharyngeal exudate.  Eyes:     Conjunctiva/sclera: Conjunctivae normal.     Pupils: Pupils are equal, round, and reactive to light.  Neck:     Musculoskeletal: Normal range of motion and neck supple.  Cardiovascular:     Rate and Rhythm: Normal rate and regular rhythm.     Heart sounds: Normal heart sounds.  Pulmonary:     Effort: Pulmonary effort is normal.     Breath sounds: Normal breath sounds.  Abdominal:     General: Bowel sounds are normal. There is no distension.     Palpations: Abdomen is soft.     Tenderness: There is no abdominal tenderness.  Musculoskeletal: Normal range of motion.        General: No swelling or tenderness.  Skin:    General: Skin is warm and dry.  Neurological:     General: No focal deficit present.      Mental Status: She is alert and oriented to person, place, and time.     Sensory: No sensory deficit.     Motor: No weakness.     Coordination: Coordination normal.     Gait: Gait normal.    Labs reviewed: Basic Metabolic Panel: No results for input(s): NA, K, CL, CO2, GLUCOSE, BUN, CREATININE, CALCIUM, MG, PHOS, TSH in the last 8760 hours.  Liver Function Tests: No results for input(s): AST, ALT, ALKPHOS, BILITOT, PROT, ALBUMIN in the last 8760 hours. No results for input(s): LIPASE, AMYLASE in the last 8760 hours. No results for input(s): AMMONIA in the last 8760 hours. CBC: No results for input(s): WBC, NEUTROABS, HGB, HCT, MCV, PLT in the last 8760 hours. Lipid Panel: No results for input(s): CHOL, HDL, LDLCALC, TRIG, CHOLHDL, LDLDIRECT in the last 8760 hours. TSH: No results for input(s): TSH in the last 8760 hours. A1C: Lab Results  Component Value Date   HGBA1C 5.4 09/02/2014     Assessment/Plan 1. Numbness and tingling in both hands -states this goes and comes, will get xray of neck due to occasional neck pain as well.  - DG Cervical Spine Complete; Future - CBC with Differential/Platelets - B12 and Folate Panel  2. Neck pain -aleve 220 mg by mouth twice with food for 7 days then stop -continue to use heat with muscle rub after heat - DG Cervical Spine Complete; Future - COMPLETE METABOLIC PANEL WITH GFR  3. Chronic left-sided low back pain with left-sided sciatica Improved with still with discomfort, no abnormalities noted on exam or warning signs identified.  -aleve 220 mg by mouth twice daily with food for 7 days -heat with muscle rub after heat - DG Lumbar Spine Complete; Future - COMPLETE METABOLIC PANEL WITH GFR  4. Lipid screening - COMPLETE METABOLIC PANEL WITH GFR - Lipid panel  5. Frequent urination - POC Urinalysis Dipstick- abnormal with trace leukocytes, due to this with burning will follow up with culture.  - Culture, Urine  6. Leukocytes in  urine - Culture, Urine  Next appt: 3 months, sooner if needed  Alani Lacivita K. Biagio Borg  [facility] [phone]

## 2018-01-22 ENCOUNTER — Ambulatory Visit
Admission: RE | Admit: 2018-01-22 | Discharge: 2018-01-22 | Disposition: A | Discharge status: Home / Self Care | Payer: BLUE CROSS/BLUE SHIELD | Source: Ambulatory Visit | Attending: Nurse Practitioner | Admitting: Nurse Practitioner

## 2018-01-22 DIAGNOSIS — G8929 Other chronic pain: Secondary | ICD-10-CM

## 2018-01-22 DIAGNOSIS — R202 Paresthesia of skin: Secondary | ICD-10-CM

## 2018-01-22 DIAGNOSIS — R200 Anesthesia of skin: Secondary | ICD-10-CM

## 2018-01-22 DIAGNOSIS — M542 Cervicalgia: Secondary | ICD-10-CM

## 2018-01-22 DIAGNOSIS — M5442 Lumbago with sciatica, left side: Principal | ICD-10-CM

## 2018-01-22 LAB — CBC WITH DIFFERENTIAL/PLATELET
Absolute Monocytes: 377 cells/uL (ref 200–950)
Basophils Absolute: 10 cells/uL (ref 0–200)
Basophils Relative: 0.3 %
EOS PCT: 1.8 %
Eosinophils Absolute: 61 cells/uL (ref 15–500)
HCT: 38.3 % (ref 35.0–45.0)
Hemoglobin: 13.3 g/dL (ref 11.7–15.5)
Lymphs Abs: 1571 cells/uL (ref 850–3900)
MCH: 32 pg (ref 27.0–33.0)
MCHC: 34.7 g/dL (ref 32.0–36.0)
MCV: 92.3 fL (ref 80.0–100.0)
MONOS PCT: 11.1 %
MPV: 10.1 fL (ref 7.5–12.5)
NEUTROS PCT: 40.6 %
Neutro Abs: 1380 cells/uL — ABNORMAL LOW (ref 1500–7800)
PLATELETS: 347 10*3/uL (ref 140–400)
RBC: 4.15 10*6/uL (ref 3.80–5.10)
RDW: 12.8 % (ref 11.0–15.0)
TOTAL LYMPHOCYTE: 46.2 %
WBC: 3.4 10*3/uL — ABNORMAL LOW (ref 3.8–10.8)

## 2018-01-22 LAB — B12 AND FOLATE PANEL: Folate: 20.9 ng/mL

## 2018-01-22 LAB — COMPLETE METABOLIC PANEL WITH GFR
AG Ratio: 1.6 (calc) (ref 1.0–2.5)
ALBUMIN MSPROF: 4.2 g/dL (ref 3.6–5.1)
ALKALINE PHOSPHATASE (APISO): 78 U/L (ref 33–130)
ALT: 24 U/L (ref 6–29)
AST: 19 U/L (ref 10–35)
BUN: 12 mg/dL (ref 7–25)
CALCIUM: 9.5 mg/dL (ref 8.6–10.4)
CO2: 29 mmol/L (ref 20–32)
CREATININE: 0.66 mg/dL (ref 0.50–1.05)
Chloride: 106 mmol/L (ref 98–110)
GFR, EST NON AFRICAN AMERICAN: 99 mL/min/{1.73_m2} (ref >=60)
GFR, Est African American: 114 mL/min/{1.73_m2} (ref >=60)
GLOBULIN: 2.6 g/dL (ref 1.9–3.7)
GLUCOSE: 85 mg/dL (ref 65–99)
Potassium: 4.5 mmol/L (ref 3.5–5.3)
SODIUM: 140 mmol/L (ref 135–146)
Total Bilirubin: 0.3 mg/dL (ref 0.2–1.2)
Total Protein: 6.8 g/dL (ref 6.1–8.1)

## 2018-01-22 LAB — LIPID PANEL
Cholesterol: 216 mg/dL — ABNORMAL HIGH (ref <200)
HDL: 69 mg/dL (ref >50)
LDL Cholesterol (Calc): 131 mg/dL (calc) — ABNORMAL HIGH
Non-HDL Cholesterol (Calc): 147 mg/dL (calc) — ABNORMAL HIGH (ref <130)
Total CHOL/HDL Ratio: 3.1 (calc) (ref <5.0)
Triglycerides: 66 mg/dL (ref <150)

## 2018-01-22 LAB — URINE CULTURE
MICRO NUMBER:: 17997
SPECIMEN QUALITY:: ADEQUATE

## 2018-01-29 ENCOUNTER — Other Ambulatory Visit: Payer: Self-pay | Admitting: Nurse Practitioner

## 2018-01-29 DIAGNOSIS — M5442 Lumbago with sciatica, left side: Secondary | ICD-10-CM

## 2018-01-29 DIAGNOSIS — M542 Cervicalgia: Secondary | ICD-10-CM

## 2018-01-29 DIAGNOSIS — G8929 Other chronic pain: Secondary | ICD-10-CM

## 2018-01-30 ENCOUNTER — Telehealth: Payer: Self-pay | Admitting: *Deleted

## 2018-01-30 DIAGNOSIS — M5442 Lumbago with sciatica, left side: Principal | ICD-10-CM

## 2018-01-30 DIAGNOSIS — G8929 Other chronic pain: Secondary | ICD-10-CM

## 2018-01-30 MED ORDER — PREDNISONE 10 MG (21) PO TBPK: ORAL_TABLET | ORAL | 0 refills | Status: DC

## 2018-01-30 NOTE — Telephone Encounter (Signed)
Patient notified and agreed.  

## 2018-01-30 NOTE — Telephone Encounter (Signed)
Prednisone taper sent to the pharmacy for her pain. Should hopefully see improvement within a few doses but may take a day or 2;  If this does not help we will need to see her back in the office for further evaluation.

## 2018-01-30 NOTE — Telephone Encounter (Signed)
Patient called and stated that she is in so much pain. Stated that it is in her neck, back and hip. Stated that she was just seen on Monday 01/21/18. Stated that she cannot take Aleve but she has been taking Ibuprofen and using heat with some relief not much. Needs relief. Stated that she is ok with pain management but she needs some relief now. Please Advise.

## 2018-02-06 ENCOUNTER — Telehealth: Payer: Self-pay | Admitting: *Deleted

## 2018-02-06 DIAGNOSIS — G8929 Other chronic pain: Secondary | ICD-10-CM

## 2018-02-06 DIAGNOSIS — M5442 Lumbago with sciatica, left side: Principal | ICD-10-CM

## 2018-02-06 MED ORDER — MELOXICAM 7.5 MG PO TABS: 7.5000 mg | ORAL_TABLET | Freq: Every day | ORAL | 0 refills | Status: DC

## 2018-02-06 NOTE — Telephone Encounter (Signed)
Patient notified and agreed.  

## 2018-02-06 NOTE — Telephone Encounter (Signed)
Would not recommend repeating prednisone at this time. mobic 7.5 mg has been sent to pharmacy at this time. To take daily with food

## 2018-02-06 NOTE — Telephone Encounter (Signed)
Patient called and stated that she has a Pain Management appointment on Tuesday 02/12/18. Patient is having pain and the Prednisone helped. Patient is requesting another Rx for the Prednisone until she can get in with the Pain Management. Please Advise.

## 2018-02-12 ENCOUNTER — Encounter: Payer: Self-pay | Admitting: Physical Therapy

## 2018-02-12 ENCOUNTER — Other Ambulatory Visit: Payer: Self-pay

## 2018-02-12 ENCOUNTER — Ambulatory Visit: Payer: BLUE CROSS/BLUE SHIELD | Attending: Nurse Practitioner | Admitting: Physical Therapy

## 2018-02-12 DIAGNOSIS — G8929 Other chronic pain: Secondary | ICD-10-CM | POA: Diagnosis present

## 2018-02-12 DIAGNOSIS — M25552 Pain in left hip: Secondary | ICD-10-CM | POA: Diagnosis present

## 2018-02-12 DIAGNOSIS — R2689 Other abnormalities of gait and mobility: Secondary | ICD-10-CM | POA: Insufficient documentation

## 2018-02-12 DIAGNOSIS — M542 Cervicalgia: Secondary | ICD-10-CM | POA: Diagnosis present

## 2018-02-12 DIAGNOSIS — M5442 Lumbago with sciatica, left side: Secondary | ICD-10-CM | POA: Insufficient documentation

## 2018-02-12 DIAGNOSIS — M62838 Other muscle spasm: Secondary | ICD-10-CM | POA: Diagnosis present

## 2018-02-13 NOTE — Therapy (Addendum)
[place], [place], [address] Phone: [phone]   Fax:  [phone]  Physical Therapy Evaluation  Patient Details  Name: Lauren Ruiz MRN: [id number] Date of Birth: [birth year] Referring Provider (PT): Sherrie Mustache    Encounter Date: 02/12/2018  PT End of Session - 02/12/18 1031    Visit Number  1    Number of Visits  12    Date for PT Re-Evaluation  03/26/18    Authorization Type  [employer]     PT Start Time  1015    PT Stop Time  1057    PT Time Calculation (min)  42 min    Activity Tolerance  Patient tolerated treatment well    Behavior During Therapy  [facility] for tasks assessed/performed       Past Medical History:  Diagnosis Date  . Anemia   . Anemia     Past Surgical History:  Procedure Laterality Date  . ABDOMINAL HYSTERECTOMY  1990  . COLON SURGERY  2005   [facility]    There were no vitals filed for this visit.   Subjective Assessment - 02/12/18 1019    Subjective  Patient has a long history of lower back pain that radiates into her left hip and into her left leg. She has also has cervical spine pain that is mostly on the left. She has had the pain for several years but the pain has increased rcently. She is very stiff and sore when she wakes up in the morning.     Limitations  Standing;Walking;Lifting;House hold activities    How long can you sit comfortably?  Sitting stiffens the patient up     How long can you stand comfortably?  pain increases the more she stands     How long can you walk comfortably?  Pain increases with distance but can be variable     Diagnostic tests  `    Currently in Pain?  Yes    Pain Score  2     Pain Location  Buttocks    Pain Orientation  Left    Pain Descriptors / Indicators  Aching;Radiating    Pain Type  Chronic pain    Pain Radiating Towards  into the left lateral hip all the way to her left calf    Pain Onset  More than a month ago    Pain  Frequency  Constant    Aggravating Factors   standing; walking'; prolonged sitting;     Pain Relieving Factors  rest     Effect of Pain on Daily Activities  difficulty perfroming work tasks          [facility] PT Assessment - 02/13/18 0001      Assessment   Medical Diagnosis  Low back pin with left radiculopaty     Referring Provider (PT)  Sherrie Mustache     Onset Date/Surgical Date  --   acute exacerbation over the past month    Next MD Visit  3 months     Prior Therapy  None       Precautions   Precautions  None      Restrictions   Weight Bearing Restrictions  No      Balance Screen   Has the patient fallen in the past 6 months  No    Has the patient had a decrease in activity level because of a fear of falling?   No    Is the  patient reluctant to leave their home because of a fear of falling?   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    Leisure  Nothing       Cognition   Overall Cognitive Status  Within Functional Limits for tasks assessed    Attention  Focused    Focused Attention  Appears intact    Memory  Appears intact    Awareness  Appears intact    Problem Solving  Appears intact      Sensation   Light Touch  Appears Intact    Additional Comments  pain radiates down the outside of her left leg       Coordination   Gross Motor Movements are Fluid and Coordinated  Yes    Fine Motor Movements are Fluid and Coordinated  Yes      Posture/Postural Control   Posture Comments  forward head       AROM   Cervical Flexion  20    Cervical Extension  22    Cervical - Right Rotation  35    Cervical - Left Rotation  44    Lumbar Flexion  no limit or pain     Lumbar Extension  no limit or pain     Lumbar - Left Side Bend  limited 50% with increased radicular symptoms into the legs     Lumbar - Right  Rotation  Normal     Lumbar - Left Rotation  Pain but no movement limitation       Strength   Overall Strength Comments  gross shoulder strength 5/5. Hrip not tested 2nd to what sounds like exacerbated arthritis in her hand     Right Hip Flexion  4/5    Right Hip ABduction  4+/5    Left Hip Flexion  4/5    Left Hip ABduction  4+/5    Left Hip ADduction  5/5      Palpation   Palpation comment  Spasming and tightness of the       Ambulation/Gait   Gait Comments  decreased single leg stance on the left; decreased hip flexion on the left; increased lateral movement                 Objective measurements completed on examination: See above findings.      [facility] PT Treatment/Exercise - 02/13/18 0001      Neck Exercises: Seated   Other Seated Exercise  scap retraction x10       Lumbar Exercises: Stretches   Lower Trunk Rotation Limitations  x10     Piriformis Stretch Limitations  3x30 sec hold bilateral     Other Lumbar Stretch Exercise  single knee to chest stretch 3x20 sec hold       Neck Exercises: Stretches   Upper Trapezius Stretch  2 reps;20 seconds;Left    Levator Stretch  2 reps;20 seconds;Left             PT Education - 02/12/18 1526    Education Details  HEP; symptom mangement;     Person(s) Educated  Patient    Methods  Explanation;Demonstration;Tactile cues;Verbal cues    Comprehension  Verbalized understanding;Returned demonstration;Verbal cues required;Tactile cues required  PT Short Term Goals - 02/13/18 1221      PT SHORT TERM GOAL #1   Title  Patient will increase lumbar flexion by 25 %     Time  4    Period  Weeks    Status  New    Target Date  03/13/18      PT SHORT TERM GOAL #2   Title  Patient will increase gross bilateral lower extremity atrength to 4+/5     Time  4    Period  Weeks    Status  New    Target Date  03/13/18      PT SHORT TERM GOAL #3   Title  Patient will increase cervical rotation by 15 degrees  bilateral     Time  4    Period  Weeks    Status  New    Target Date  03/13/18        PT Long Term Goals - 02/13/18 1222      PT LONG TERM GOAL #1   Title  Patient will be independent with intial HEP     Time  8    Period  Weeks    Status  New    Target Date  04/10/18      PT LONG TERM GOAL #2   Title  Patient will stand for 45 minutes without self reported pain in order to perfrom work tasks     Time  8    Period  Weeks    Status  New    Target Date  04/10/18      PT LONG TERM GOAL #3   Title  Patient will demostrate a 30% limitation on FOTO     Time  8    Period  Weeks    Status  New    Target Date  04/10/18             Plan - 02/12/18 1032    Clinical Impression Statement  Patient is a 58 year old female with left hip and left cervical pain. Signs and symtpoms in her lumbar spine are consitent with lumbar DDD, trochanteric bursitis and potentially piriformis syndrome. In her cervical spine she has a muscle spasmin in her uppertrap which is likley exacerbating her cervical degeneration. She has limited lumbar flexion and decreased hip and core strength. She has limited cervical motion an increased spasming into her upper trap and cervical spine. She would benefit from skilled therapy to decrease her pain and impve her ability to perfrom work tasks.     History and Personal Factors relevant to plan of care:  no PMH      Clinical Presentation  Evolving    Clinical Presentation due to:  pain thathas been increasing over the past month in her hip and left cervical spine.     Clinical Decision Making  Moderate    Rehab Potential  Good    PT Frequency  2x / week    PT Duration  8 weeks    PT Treatment/Interventions  ADLs/Self Care Home Management;Cryotherapy;Electrical Stimulation;Traction;Ultrasound;Gait training;Stair training;Moist Heat;Iontophoresis 7m/ml Dexamethasone;Therapeutic activities;Therapeutic exercise;Neuromuscular re-education;Cognitive  remediation;Patient/family education;Manual techniques;Passive range of motion;Dry needling;Splinting    PT Next Visit Plan  Patient may benefit from soft tissue mobilization to her lumbar spine and trochater area on the left lumbar spine and into the left cervical spine; review HEP for technique. Assess exercises that helped. consider light core work; supine ball squeeze, clamshell; march; brge all with ab  breathing; would benefit from scpular psotrual exercises when she can     PT Home Exercise Plan  upper trap stretch; levator stretch; scpa retraction; single knee to chest stretch; piriformis stretchlower trunk rotation        Patient will benefit from skilled therapeutic intervention in order to improve the following deficits and impairments:  Pain, Abnormal gait, Decreased activity tolerance, Decreased safety awareness, Difficulty walking, Decreased strength, Decreased range of motion, Decreased endurance, Impaired UE functional use, Increased muscle spasms, Postural dysfunction  Visit Diagnosis: Chronic left-sided low back pain with left-sided sciatica  Pain in left hip  Cervicalgia  Other abnormalities of gait and mobility  Other muscle spasm   PHYSICAL THERAPY DISCHARGE SUMMARY  Visits from Start of Care: 1  Current functional level related to goals / functional outcomes: Unknown    Remaining deficits: Unknown    Education / Equipment: Unknown   Plan: Patient agrees to discharge.  Patient goals were not met. Patient is being discharged due to not returning since the last visit.  ?????       Problem List Patient Active Problem List   Diagnosis Date Noted  . Right-sided low back pain with right-sided sciatica 09/10/2015  . Right shoulder pain 09/10/2015  . Continuous tobacco abuse 09/10/2015  . Iron deficiency anemia 09/10/2015    Carney Living PT DPT  02/13/2018, 12:44 PM  [facility] [address] [place], [place], [address] Phone: [phone]   Fax:  [phone]  Name: MISCHA BRITTINGHAM MRN: [id number] Date of Birth: [birth year]

## 2018-02-20 ENCOUNTER — Telehealth: Payer: Self-pay

## 2018-02-20 NOTE — Telephone Encounter (Signed)
I would recommend an office visit to discuss what is going on. Also we need to get her records from pain management as well.

## 2018-02-20 NOTE — Telephone Encounter (Addendum)
Spoke with patient, patient states she seen her pain management doctor and they gave her some exercise to do. Patient sates she needs another round of prednisone. Patient will see pain management doctor again next week  Please advise

## 2018-02-20 NOTE — Telephone Encounter (Signed)
Called patient, no answer, and unable to leave a voicemail  I will try to call patient again later

## 2018-02-20 NOTE — Telephone Encounter (Signed)
Per phone note on 02/06/18 she had a Pain Management appointment on Tuesday 02/12/18. I would encourage her to follow up with them at this time.

## 2018-02-20 NOTE — Telephone Encounter (Signed)
Patient called stating that meloxicam (mobic) is ineffective. Patient with back pain, leg spasms, and decreased mobility.  Last OV 01/21/2018   Please advise

## 2018-02-21 ENCOUNTER — Ambulatory Visit (INDEPENDENT_AMBULATORY_CARE_PROVIDER_SITE_OTHER): Payer: BLUE CROSS/BLUE SHIELD | Admitting: Family

## 2018-02-21 ENCOUNTER — Encounter: Payer: Self-pay | Admitting: Family

## 2018-02-21 VITALS — BP 150/80 | HR 85 | Temp 99.3°F | Resp 18 | Ht 62.0 in | Wt 166.2 lb

## 2018-02-21 DIAGNOSIS — I83812 Varicose veins of left lower extremities with pain: Secondary | ICD-10-CM | POA: Diagnosis not present

## 2018-02-21 DIAGNOSIS — G609 Hereditary and idiopathic neuropathy, unspecified: Secondary | ICD-10-CM | POA: Diagnosis not present

## 2018-02-21 DIAGNOSIS — K219 Gastro-esophageal reflux disease without esophagitis: Secondary | ICD-10-CM

## 2018-02-21 MED ORDER — GABAPENTIN 100 MG PO CAPS: 100.0000 mg | ORAL_CAPSULE | Freq: Every day | ORAL | 3 refills | Status: DC

## 2018-02-21 NOTE — Patient Instructions (Addendum)
Gabapentin 100 mg tablet take one by mouth daily for pain/[place]/tingling  2. Referral placed for Vascular specialist for varicose vein  3. Omeprazole 20 mg tablet one by mouth daily for acid reflux   Varicose Veins Varicose veins are veins that have become enlarged, bulged, and twisted. They most often appear in the legs. What are the causes? This condition is caused by damage to the valves in the vein. These valves help blood return to your heart. When they are damaged and they stop working properly, blood may flow backward and back up in the veins near the skin, causing the veins to get larger and appear twisted. The condition can result from any issue that causes blood to back up, like pregnancy, prolonged standing, or obesity. What increases the risk? This condition is more likely to develop in people who are:  On their feet a lot.  Pregnant.  Overweight. What are the signs or symptoms? Symptoms of this condition include:  Bulging, twisted, and bluish veins.  A feeling of heaviness. This may be worse at the end of the day.  Leg pain. This may be worse at the end of the day.  Swelling in the leg.  Changes in skin color over the veins. How is this diagnosed? This condition may be diagnosed based on your symptoms, a physical exam, and an ultrasound test. How is this treated? Treatment for this condition may involve:  Avoiding sitting or standing in one position for long periods of time.  Wearing compression stockings. These stockings help to prevent blood clots and reduce swelling in the legs.  Raising (elevating) the legs when resting.  Losing weight.  Exercising regularly. If you have persistent symptoms or want to improve the way your varicose veins look, you may choose to have a procedure to close the varicose veins off or to remove them. Treatments to close off the veins include:  Sclerotherapy. In this treatment, a solution is injected into a vein to close it  off.  Laser treatment. In this treatment, the vein is heated with a laser to close it off.  Radiofrequency vein ablation. In this treatment, an electrical current produced by radio waves is used to close off the vein. Treatments to remove the veins include:  Phlebectomy. In this treatment, the veins are removed through small incisions made over the veins.  Vein ligation and stripping. In this treatment, incisions are made over the veins. The veins are then removed after being tied (ligated) with stitches (sutures). Follow these instructions at home: Activity  Walk as much as possible. Walking increases blood flow. This helps blood return to the heart and takes pressure off your veins. It also increases your cardiovascular strength.  Follow your health care provider's instructions about exercising.  Do not stand or sit in one position for a long period of time.  Do not sit with your legs crossed.  Rest with your legs raised during the day. General instructions   Follow any diet instructions given to you by your health care provider.  Wear compression stockings as directed by your health care provider. Do not wear other kinds of tight clothing around your legs, pelvis, or waist.  Elevate your legs at night to above the level of your heart.  If you get a cut in the skin over the varicose vein and the vein bleeds: ? Lie down with your leg raised. ? Apply firm pressure to the cut with a clean cloth until the bleeding stops. ? Place a  bandage (dressing) on the cut. Contact a health care provider if:  The skin around your varicose veins starts to break down.  You have pain, redness, tenderness, or hard swelling over a vein.  You are uncomfortable because of pain.  You get a cut in the skin over a varicose vein and it will not stop bleeding. Summary  Varicose veins are veins that have become enlarged, bulged, and twisted. They most often appear in the legs.  This condition is  caused by damage to the valves in the vein. These valves help blood return to your heart.  Treatment for this condition includes frequent movements, wearing compression stockings, losing weight, and exercising regularly. In some cases, procedures are done to close off or remove the veins.  Treatment for this condition may include wearing compression stockings, elevating the legs, losing weight, and engaging in regular activity. In some cases, procedures are done to close off or remove the veins. This information is not intended to replace advice given to you by your health care provider. Make sure you discuss any questions you have with your health care provider. Document Released: 10/12/2004 Document Revised: 01/26/2016 Document Reviewed: 01/26/2016 Elsevier Interactive Patient Education  2019 [employer].

## 2018-02-21 NOTE — Telephone Encounter (Signed)
Spoke with patient, patient coming in today at 11:00 am

## 2018-02-21 NOTE — Progress Notes (Signed)
Provider: Raeleigh Guinn FNP-C  Sharon Seller, NP  Patient Care Team: Sharon Seller, NP as PCP - General (Geriatric Medicine) Charlsie Merles Kirstie Peri, DPM as Consulting Physician (Podiatry)  Extended Emergency Contact Information Primary Emergency Contact: Marissa Nestle Address: [address]          [place], [place] [address] [place] Home Phone: [phone] Relation: Daughter  Goals of care: Advanced Directive information Advanced Directives 02/12/2018  Does Patient Have a Medical Advance Directive? No  Would patient like information on creating a medical advance directive? No - Patient declined     Chief Complaint  Patient presents with  . Acute Visit    Left Leg Cramping patient states this has been hurting for 2 weeks and is having hard time walking . Patient does not want medication unless necessary.   . Medication Management    Meloxicam is not helping for pain, states prednisone was only thing that has worked    HPI:  Pt is a 58 y.o. female seen today for an acute visit for evaluation of left leg cramping x 2 weeks.she has taken her meloxicam without any relief.she states was referred to pain management but exercise has not help either.she states has numbness and tingling on both hands and left leg.Leg pain worst on left calf muscle and along the back of the thigh.she states stands a lot at work. She denies any fever,chills or cough.Temp 99.3 but states no symptoms.    Past Medical History:  Diagnosis Date  . Anemia   . Anemia    Past Surgical History:  Procedure Laterality Date  . ABDOMINAL HYSTERECTOMY  1990  . COLON SURGERY  2005   [facility]    Allergies  Allergen Reactions  . Acetaminophen Other (See Comments)    ulcers    Outpatient Encounter Medications as of 02/21/2018  Medication Sig  . Biotin 5 MG CAPS Take 1 capsule by mouth daily.  . ferrous gluconate (FERGON) 325 MG tablet Take 325 mg by mouth daily with breakfast.      . ibuprofen (ADVIL,MOTRIN) 800 MG tablet Take 1 tablet (800 mg total) by mouth every 8 (eight) hours as needed.  . magnesium gluconate (MAGONATE) 500 MG tablet Take 500 mg by mouth daily.  . meloxicam (MOBIC) 7.5 MG tablet Take 1 tablet (7.5 mg total) by mouth daily.  . Multiple Vitamins-Minerals (CENTRUM SILVER ULTRA WOMENS) TABS Take 1 tablet by mouth daily.  . [DISCONTINUED] predniSONE (STERAPRED UNI-PAK 21 TAB) 10 MG (21) TBPK tablet Use as directed   No facility-administered encounter medications on file as of 02/21/2018.     Review of Systems  Constitutional: Negative for appetite change, chills, fatigue and fever.  HENT: Negative for congestion, rhinorrhea, sinus pressure, sinus pain, sneezing and sore throat.   Eyes: Negative for discharge, redness, itching and visual disturbance.  Respiratory: Negative for cough, chest tightness, shortness of breath and wheezing.   Cardiovascular: Negative for chest pain, palpitations and leg swelling.  Gastrointestinal: Negative for abdominal distention, constipation, diarrhea, nausea and vomiting.       Occasional abdomen  pain due to acid reflux.states likes spices food.  Skin: Negative for color change, pallor and rash.  Neurological: Positive for numbness. Negative for dizziness, light-headedness and headaches.       Tingling of hands and left leg      There is no immunization history on file for this patient. Pertinent  Health Maintenance Due  Topic Date Due  . COLONOSCOPY  09/21/2011  .  MAMMOGRAM  09/27/2016  . INFLUENZA VACCINE  08/16/2017  . PAP SMEAR-Modifier  03/03/2018   Fall Risk  01/21/2018 10/13/2015 02/17/2015 10/07/2014 09/02/2014  Falls in the past year? 1 No No No No  Number falls in past yr: 0 - - - -  Injury with Fall? 0 - - - -    Vitals:   02/21/18 1110  BP: (!) 150/80  Pulse: 85  Resp: 18  Temp: 99.3 F (37.4 C)  TempSrc: Oral  SpO2: 97%  Weight: 166 lb 3.2 oz (75.4 kg)  Height: 5\' 2"  (1.575 m)   Body mass  index is 30.4 kg/m. Physical Exam Vitals signs reviewed.  Constitutional:      General: She is not in acute distress.    Appearance: She is not toxic-appearing.  HENT:     Head: Normocephalic.     Nose: No congestion or rhinorrhea.     Mouth/Throat:     Mouth: Mucous membranes are moist.     Pharynx: Oropharynx is clear. No oropharyngeal exudate or posterior oropharyngeal erythema.  Eyes:     General: No scleral icterus.       Right eye: No discharge.        Left eye: No discharge.     Conjunctiva/sclera: Conjunctivae normal.     Pupils: Pupils are equal, round, and reactive to light.  Neck:     Musculoskeletal: Normal range of motion. No muscular tenderness.  Cardiovascular:     Rate and Rhythm: Normal rate and regular rhythm.     Pulses: Normal pulses.     Heart sounds: Normal heart sounds. No murmur. No friction rub. No gallop.   Pulmonary:     Effort: Pulmonary effort is normal.     Breath sounds: Normal breath sounds. No wheezing, rhonchi or rales.  Chest:     Chest wall: No tenderness.  Abdominal:     General: Bowel sounds are normal. There is no distension.     Palpations: Abdomen is soft. There is no mass.     Tenderness: There is no abdominal tenderness. There is no right CVA tenderness, left CVA tenderness, guarding or rebound.  Musculoskeletal: Normal range of motion.        General: No swelling or tenderness.     Right lower leg: No edema.     Left lower leg: No edema.  Lymphadenopathy:     Cervical: No cervical adenopathy.  Skin:    General: Skin is warm and dry.     Capillary Refill: Capillary refill takes 2 to 3 seconds.     Coloration: Skin is not pale.     Findings: No erythema or rash.  Neurological:     Mental Status: She is alert and oriented to person, place, and time.     Cranial Nerves: No cranial nerve deficit.     Sensory: Sensory deficit present.     Motor: No weakness.     Coordination: Coordination normal.     Gait: Gait is intact. Gait  normal.  Psychiatric:        Mood and Affect: Mood normal.        Behavior: Behavior normal.        Thought Content: Thought content normal.        Judgment: Judgment normal.     Labs reviewed: Recent Labs    01/21/18 1112  NA 140  K 4.5  CL 106  CO2 29  GLUCOSE 85  BUN 12  CREATININE 0.66  CALCIUM  9.5   Recent Labs    01/21/18 1112  AST 19  ALT 24  BILITOT 0.3  PROT 6.8   Recent Labs    01/21/18 1112  WBC 3.4*  NEUTROABS 1,380*  HGB 13.3  HCT 38.3  MCV 92.3  PLT 347   Lab Results  Component Value Date   TSH 1.430 09/02/2014   Lab Results  Component Value Date   HGBA1C 5.4 09/02/2014   Lab Results  Component Value Date   CHOL 216 (H) 01/21/2018   HDL 69 01/21/2018   LDLCALC 131 (H) 01/21/2018   TRIG 66 01/21/2018   CHOLHDL 3.1 01/21/2018    Significant Diagnostic Results in last 30 days:  Dg Cervical Spine Complete  Result Date: 01/23/2018 CLINICAL DATA:  Initial evaluation for neck pain with numbness and tingling in bilateral hands for 3 years. EXAM: CERVICAL SPINE - COMPLETE 4+ VIEW COMPARISON:  None. FINDINGS: Mild straightening of the normal cervical lordosis. No listhesis or subluxation. Vertebral body heights maintained without evidence for acute or chronic fracture. Normal C1-2 articulations are preserved in the dens is intact. Prominent bulky anterior osteophytic spurring present at C3-4 through C6-7, with mild degenerative intervertebral disc space narrowing at these levels. Mild to moderate bony foraminal narrowing seen on the right at C3-4 through C6-7, most pronounced at C5-6. Mild to moderate bony foraminal narrowing seen on the left at C4-5 through C6-7. Soft tissues of the neck demonstrate no acute finding. IMPRESSION: Prominent bulky anterior osteophytic spurring at C3-4 through C6-7 with associated mild to moderate bilateral bony foraminal narrowing as above. Electronically Signed   By: Rise Mu M.D.   On: 01/23/2018 06:38   Dg  Lumbar Spine Complete  Result Date: 01/23/2018 CLINICAL DATA:  Initial evaluation for low back pain with left-sided sciatica for 3 years. EXAM: LUMBAR SPINE - COMPLETE 4+ VIEW COMPARISON:  Prior radiograph from 09/09/2015. FINDINGS: There is no evidence of lumbar spine fracture. Trace anterolisthesis of L4 on L5. Alignment otherwise normal with preservation of the normal lumbar lordosis. Intervertebral disc spaces are maintained. Mild facet arthrosis at L4-5 bilaterally, likely progressed from previous. IMPRESSION: 1. Bilateral facet hypertrophy at L4-5 with associated trace grade 1 anterolisthesis. 2. Otherwise negative radiographs of the lumbar spine. Electronically Signed   By: Rise Mu M.D.   On: 01/23/2018 06:36    Assessment/Plan 1. Varicose veins of left lower extremity with pain Worsening bluish purple varicose vein on posterior left thigh area.No redness,or swelling to palpation .ASA EC 81 mg tablet one by mouth daily.  - Ambulatory referral to Vascular Surgery  2. Idiopathic peripheral neuropathy Chronic numbness and tingling worsening on fingertips and left leg.monofilament decreased sensation to heel and plantar areas.Start on Gabapentin 100 mg Tablet one by mouth at bedtime. Encouraged to wear support hose daily.    3. Gastroesophageal reflux disease without esophagitis Take omeprazole 20 mg tablet one by mouth daily.  Family/ staff Communication: Reviewed plan of care with patient.  Labs/tests ordered: None   Colt Martelle C Paulett Kaufhold, NP

## 2018-02-25 ENCOUNTER — Ambulatory Visit: Payer: BLUE CROSS/BLUE SHIELD | Admitting: Physical Therapy

## 2018-04-10 ENCOUNTER — Encounter: Payer: BLUE CROSS/BLUE SHIELD | Admitting: Vascular Surgery

## 2018-04-10 ENCOUNTER — Encounter (HOSPITAL_COMMUNITY): Payer: BLUE CROSS/BLUE SHIELD

## 2018-04-22 ENCOUNTER — Encounter: Payer: Self-pay | Admitting: Nurse Practitioner

## 2018-04-22 ENCOUNTER — Ambulatory Visit (INDEPENDENT_AMBULATORY_CARE_PROVIDER_SITE_OTHER): Payer: BLUE CROSS/BLUE SHIELD | Admitting: Nurse Practitioner

## 2018-04-22 ENCOUNTER — Other Ambulatory Visit: Payer: Self-pay

## 2018-04-22 DIAGNOSIS — E785 Hyperlipidemia, unspecified: Secondary | ICD-10-CM

## 2018-04-22 DIAGNOSIS — I83812 Varicose veins of left lower extremities with pain: Secondary | ICD-10-CM

## 2018-04-22 DIAGNOSIS — N3281 Overactive bladder: Secondary | ICD-10-CM

## 2018-04-22 DIAGNOSIS — K219 Gastro-esophageal reflux disease without esophagitis: Secondary | ICD-10-CM

## 2018-04-22 DIAGNOSIS — Z1159 Encounter for screening for other viral diseases: Secondary | ICD-10-CM | POA: Diagnosis not present

## 2018-04-22 DIAGNOSIS — M5441 Lumbago with sciatica, right side: Secondary | ICD-10-CM | POA: Diagnosis not present

## 2018-04-22 DIAGNOSIS — Z1231 Encounter for screening mammogram for malignant neoplasm of breast: Secondary | ICD-10-CM | POA: Diagnosis not present

## 2018-04-22 DIAGNOSIS — G8929 Other chronic pain: Secondary | ICD-10-CM

## 2018-04-22 DIAGNOSIS — D509 Iron deficiency anemia, unspecified: Secondary | ICD-10-CM

## 2018-04-22 MED ORDER — MIRABEGRON ER 25 MG PO TB24: 25.0000 mg | ORAL_TABLET | Freq: Every day | ORAL | 1 refills | Status: DC

## 2018-04-22 NOTE — Patient Instructions (Addendum)
To start MYRBETRIQ 25 mg by mouth daily for overactive bladder- may take several weeks to show benefit  Continue to work on heart healthy diet.    Fat and Cholesterol Restricted Eating Plan Getting too much fat and cholesterol in your diet may cause health problems. Choosing the right foods helps keep your fat and cholesterol at normal levels. This can keep you from getting certain diseases.  What are tips for following this plan? Meal planning  At meals, divide your plate into four equal parts: ? Fill one-half of your plate with vegetables and green salads. ? Fill one-fourth of your plate with whole grains. ? Fill one-fourth of your plate with low-fat (lean) protein foods.  Eat fish that is high in omega-3 fats at least two times a week. This includes mackerel, tuna, sardines, and salmon.  Eat foods that are high in fiber, such as whole grains, beans, apples, broccoli, carrots, peas, and barley. General tips   Work with your doctor to lose weight if you need to.  Avoid: ? Foods with added sugar. ? Fried foods. ? Foods with partially hydrogenated oils.  Limit alcohol intake to no more than 1 drink a day for nonpregnant women and 2 drinks a day for men. One drink equals 12 oz of beer, 5 oz of wine, or 1 oz of hard liquor. Reading food labels  Check food labels for: ? Trans fats. ? Partially hydrogenated oils. ? Saturated fat (g) in each serving. ? Cholesterol (mg) in each serving. ? Fiber (g) in each serving.  Choose foods with healthy fats, such as: ? Monounsaturated fats. ? Polyunsaturated fats. ? Omega-3 fats.  Choose grain products that have whole grains. Look for the word "whole" as the first word in the ingredient list. Cooking  Cook foods using low-fat methods. These include baking, boiling, grilling, and broiling.  Eat more home-cooked foods. Eat at restaurants and buffets less often.  Avoid cooking using saturated fats, such as butter, cream, palm oil, palm  kernel oil, and coconut oil. Recommended foods  Fruits  All fresh, canned (in natural juice), or frozen fruits. Vegetables  Fresh or frozen vegetables (raw, steamed, roasted, or grilled). Green salads. Grains  Whole grains, such as whole wheat or whole grain breads, crackers, cereals, and pasta. Unsweetened oatmeal, bulgur, barley, quinoa, or brown rice. Corn or whole wheat flour tortillas. Meats and other protein foods  Ground beef (85% or leaner), grass-fed beef, or beef trimmed of fat. Skinless chicken or [place]. Ground chicken or [place]. Pork trimmed of fat. All fish and seafood. Egg whites. Dried beans, peas, or lentils. Unsalted nuts or seeds. Unsalted canned beans. Nut butters without added sugar or oil. Dairy  Low-fat or nonfat dairy products, such as skim or 1% milk, 2% or reduced-fat cheeses, low-fat and fat-free ricotta or cottage cheese, or plain low-fat and nonfat yogurt. Fats and oils  Tub margarine without trans fats. Light or reduced-fat mayonnaise and salad dressings. Avocado. Olive, canola, sesame, or safflower oils. The items listed above may not be a complete list of foods and beverages you can eat. Contact a dietitian for more information. Foods to avoid Fruits  Canned fruit in heavy syrup. Fruit in cream or butter sauce. Fried fruit. Vegetables  Vegetables cooked in cheese, cream, or butter sauce. Fried vegetables. Grains  White bread. White pasta. White rice. Cornbread. Bagels, pastries, and croissants. Crackers and snack foods that contain trans fat and hydrogenated oils. Meats and other protein foods  Fatty cuts of meat.  Ribs, chicken wings, bacon, sausage, bologna, salami, chitterlings, fatback, hot dogs, bratwurst, and packaged lunch meats. Liver and organ meats. Whole eggs and egg yolks. Chicken and [place] with skin. Fried meat. Dairy  Whole or 2% milk, cream, half-and-half, and cream cheese. Whole milk cheeses. Whole-fat or sweetened yogurt. Full-fat  cheeses. Nondairy creamers and whipped toppings. Processed cheese, cheese spreads, and cheese curds. Beverages  Alcohol. Sugar-sweetened drinks such as sodas, lemonade, and fruit drinks. Fats and oils  Butter, stick margarine, lard, shortening, ghee, or bacon fat. Coconut, palm kernel, and palm oils. Sweets and desserts  Corn syrup, sugars, honey, and molasses. Candy. Jam and jelly. Syrup. Sweetened cereals. Cookies, pies, cakes, donuts, muffins, and ice cream. The items listed above may not be a complete list of foods and beverages you should avoid. Contact a dietitian for more information. Summary  Choosing the right foods helps keep your fat and cholesterol at normal levels. This can keep you from getting certain diseases.  At meals, fill one-half of your plate with vegetables and green salads.  Eat high-fiber foods, like whole grains, beans, apples, carrots, peas, and barley.  Limit added sugar, saturated fats, alcohol, and fried foods. This information is not intended to replace advice given to you by your health care provider. Make sure you discuss any questions you have with your health care provider. Document Released: 07/04/2011 Document Revised: 09/05/2017 Document Reviewed: 09/19/2016 Elsevier Interactive Patient Education  2019 [employer].

## 2018-04-22 NOTE — Progress Notes (Signed)
This service is provided via telemedicine  No vital signs collected/recorded due to the encounter was a telemedicine visit.   Location of patient (ex: home, work):  Home  Patient consents to a telephone visit: Yes  Location of the provider (ex: office, home):  [employer], Office   Names of all persons participating in the telemedicine service and their role in the encounter: S.Chrae B/CMA, Abbey Chatters, NP, and Patient   Time spent on call:  9 min with medical assistant   Virtual Visit via Telephone Note  I connected with Tyshell Erling Cruz on 04/22/18 at 10:30 AM EDT by telephone and verified that I am speaking with the correct person using two identifiers.   I discussed the limitations, risks, security and privacy concerns of performing an evaluation and management service by telephone and the availability of in person appointments. I also discussed with the patient that there may be a patient responsible charge related to this service. The patient expressed understanding and agreed to proceed.     Careteam: Patient Care Team: Sharon Seller, NP as PCP - General (Geriatric Medicine) Charlsie Merles Kirstie Peri, DPM as Consulting Physician (Podiatry)  Advanced Directive information Does Patient Have a Medical Advance Directive?: No, Would patient like information on creating a medical advance directive?: Yes (MAU/Ambulatory/Procedural Areas - Information given)  Allergies  Allergen Reactions  . Acetaminophen Other (See Comments)    ulcers    Chief Complaint  Patient presents with  . Medical Management of Chronic Issues    3 month follow-up. Patient went to PT x 1 due to cost, patient was told in network yet she got a bill for it.   . Best Practice Recommendations    Discuss need for Hep C, HIV, Tdap, Colonoscopy, and Mammogram  . Urinary Frequency    Ongoing concern, patient c/o frequent urination at night (5-6 times)x several years and is progressivly getting worse.    . Advanced Directive    Discuss HCPOA/Living Will      HPI: Patient is a 58 y.o. female for routine follow up via tele-visit due to COVID-19.   Pt with urination frequency- UA was abnormal, but culture did not reveal UTI. Has not been on any medication for urinary frequency. Will have urge and stress incontinence occasionally but not needing to wear a pad. Had been to GYN and had a procedure that was supposed to help but made it worse.   GERD- occasional, attempts diet modifications first, uses famotidine PRN for severe symptoms ( 3 times a month)   Anemia- stable on iron daily  Varicose veins of lower extremities-   chronic back pain/leg pain- got a foot massager which helped significantly. Did 1 treatment of PT and she cancelled further appt. Then get a bill.  Continues on ASA 81 mg 2-3 times weekly (does not wish to take daily)  Numbness and tingling in leg has improved and she is not taking gabapentin- wants to avoid medication.   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    Review of Systems:  Review of Systems  Constitutional: Negative for chills, fever and weight loss.  HENT: Negative for tinnitus.   Respiratory: Negative for cough, sputum production and shortness of breath.   Cardiovascular: Negative for chest pain, palpitations and leg swelling.  Gastrointestinal: Negative for abdominal pain, constipation, diarrhea and heartburn.  Genitourinary: Positive for frequency and urgency. Negative for dysuria.  Musculoskeletal: Positive for back pain, joint  pain and myalgias. Negative for falls and neck pain.       Pain better in leg and back- significantly better  Skin: Negative.   Neurological: Negative for dizziness, tingling, sensory change and headaches.  Endo/Heme/Allergies:       Hot flashes  Psychiatric/Behavioral: Negative for depression and memory loss. The patient has insomnia (? due to OAB).    Past Medical History:  Diagnosis  Date  . Anemia   . Anemia    Past Surgical History:  Procedure Laterality Date  . ABDOMINAL HYSTERECTOMY  1990  . COLON SURGERY  2005   [facility]   Social History:   reports that she quit smoking about 2 years ago. Her smoking use included cigarettes. She quit after 32.00 years of use. She has never used smokeless tobacco. She reports current alcohol use. She reports current drug use. Drug: Marijuana.  History reviewed. No pertinent family history.  Medications: Patient's Medications  New Prescriptions   MIRABEGRON ER (MYRBETRIQ) 25 MG TB24 TABLET    Take 1 tablet (25 mg total) by mouth daily.  Previous Medications   BIOTIN 5 MG CAPS    Take 1 capsule by mouth daily.   FAMOTIDINE (PEPCID PO)    Take by mouth. As needed for acid reflux   FERROUS GLUCONATE (FERGON) 325 MG TABLET    Take 325 mg by mouth daily with breakfast.     IBUPROFEN (ADVIL,MOTRIN) 800 MG TABLET    Take 1 tablet (800 mg total) by mouth every 8 (eight) hours as needed.   MAGNESIUM GLUCONATE (MAGONATE) 500 MG TABLET    Take 500 mg by mouth daily.   MULTIPLE VITAMINS-MINERALS (CENTRUM SILVER ULTRA WOMENS) TABS    Take 1 tablet by mouth daily.  Modified Medications   No medications on file  Discontinued Medications   GABAPENTIN (NEURONTIN) 100 MG CAPSULE    Take 1 capsule (100 mg total) by mouth at bedtime.   MELOXICAM (MOBIC) 7.5 MG TABLET    Take 1 tablet (7.5 mg total) by mouth daily.     Physical Exam:  Unable due to tele-visit.   Labs reviewed: Basic Metabolic Panel: Recent Labs    01/21/18 1112  NA 140  K 4.5  CL 106  CO2 29  GLUCOSE 85  BUN 12  CREATININE 0.66  CALCIUM 9.5   Liver Function Tests: Recent Labs    01/21/18 1112  AST 19  ALT 24  BILITOT 0.3  PROT 6.8   No results for input(s): LIPASE, AMYLASE in the last 8760 hours. No results for input(s): AMMONIA in the last 8760 hours. CBC: Recent Labs    01/21/18 1112  WBC 3.4*  NEUTROABS 1,380*  HGB 13.3  HCT 38.3   MCV 92.3  PLT 347   Lipid Panel: Recent Labs    01/21/18 1112  CHOL 216*  HDL 69  LDLCALC 131*  TRIG 66  CHOLHDL 3.1   TSH: No results for input(s): TSH in the last 8760 hours. A1C: Lab Results  Component Value Date   HGBA1C 5.4 09/02/2014     Assessment/Plan  1. Overactive bladder -ongoing, has tired kegel exercises. Went to GYN and procedure made it worse. UA c&S done at previous appt without UTI. - mirabegron ER (MYRBETRIQ) 25 MG TB24 tablet; Take 1 tablet (25 mg total) by mouth daily.  Dispense: 30 tablet; Refill: 1  2. Screening mammogram, encounter for - MM DIGITAL SCREENING BILATERAL; Future  3. Encounter for hepatitis C screening test for low  risk patient - Hepatitis C antibody; Future  4. Chronic right-sided low back pain with right-sided sciatica -has gotten massage which has significantly helped. Pain is much better, not needing PRN gabapentin or other medication.   5. Iron deficiency anemia, unspecified iron deficiency anemia type -hgb stable on last labs.  -continues on iron daily - CBC with Differential/Platelet; Future  6. Varicose veins of left lower extremity with pain Pain in legs have improved. She has put off her vascular appt due to COVID-19. Continues on ASA 81 mg 2-3 times weekly, does not wish to take it more often.  7. Gastroesophageal reflux disease without esophagitis Stable with dietary modifications. Continues on famotidine PRN  8. Hyperlipidemia LDL goal <130 -diet modifications encouraged.  - COMPLETE METABOLIC PANEL WITH GFR; Future - Lipid Panel; Future  Next appt: 4 months, with labs prior.  Janene Harvey. Biagio Borg  [facility] [phone]    Follow Up Instructions:    I discussed the assessment and treatment plan with the patient. The patient was provided an opportunity to ask questions and all were answered. The patient agreed with the plan and demonstrated an understanding of the  instructions.   The patient was advised to call back or seek an in-person evaluation if the symptoms worsen or if the condition fails to improve as anticipated.  I provided 19 minutes of non-face-to-face time during this encounter.   Sharon Seller, NP  avs printed and mailed.

## 2018-06-27 IMAGING — CR DG LUMBAR SPINE COMPLETE 4+V
5 series · 5 of 5 positions shown · non-contrast
Comparison: None in PACs

CLINICAL DATA: Three months of low back pain and numbness extending
into the right leg without known injury

EXAM:
LUMBAR SPINE - COMPLETE 4+ VIEW

[t l-spine a.p.]
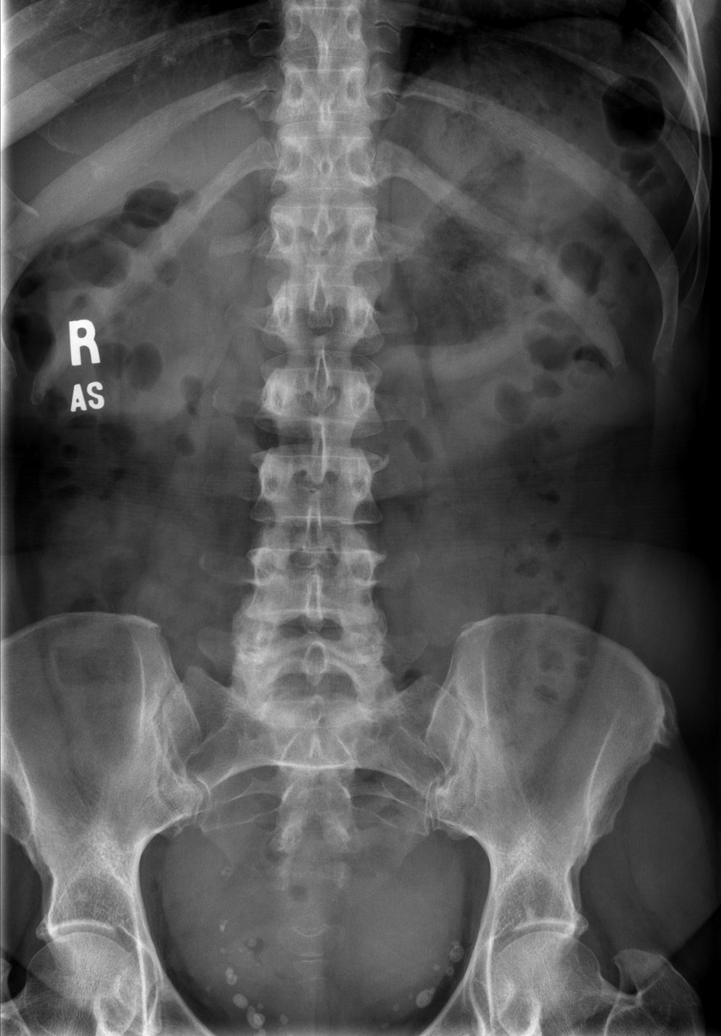

[t l-spine oblique exposure (1 of 2)]
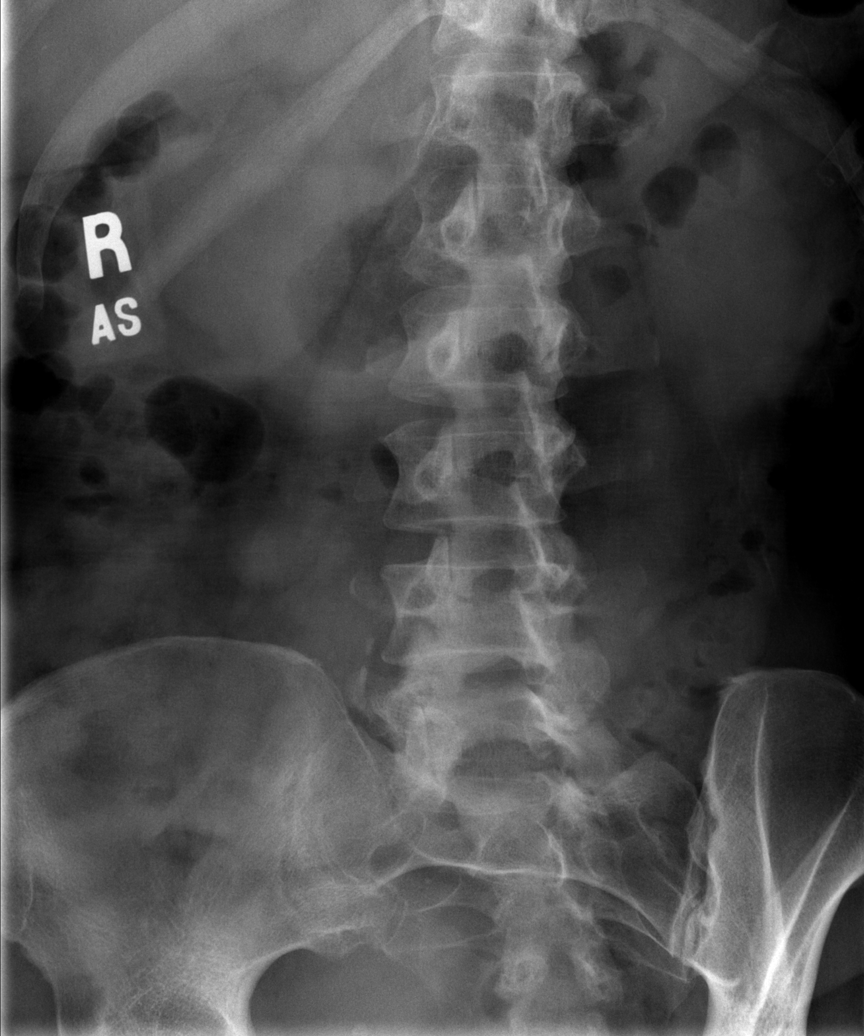

[t l-spine oblique exposure (2 of 2)]
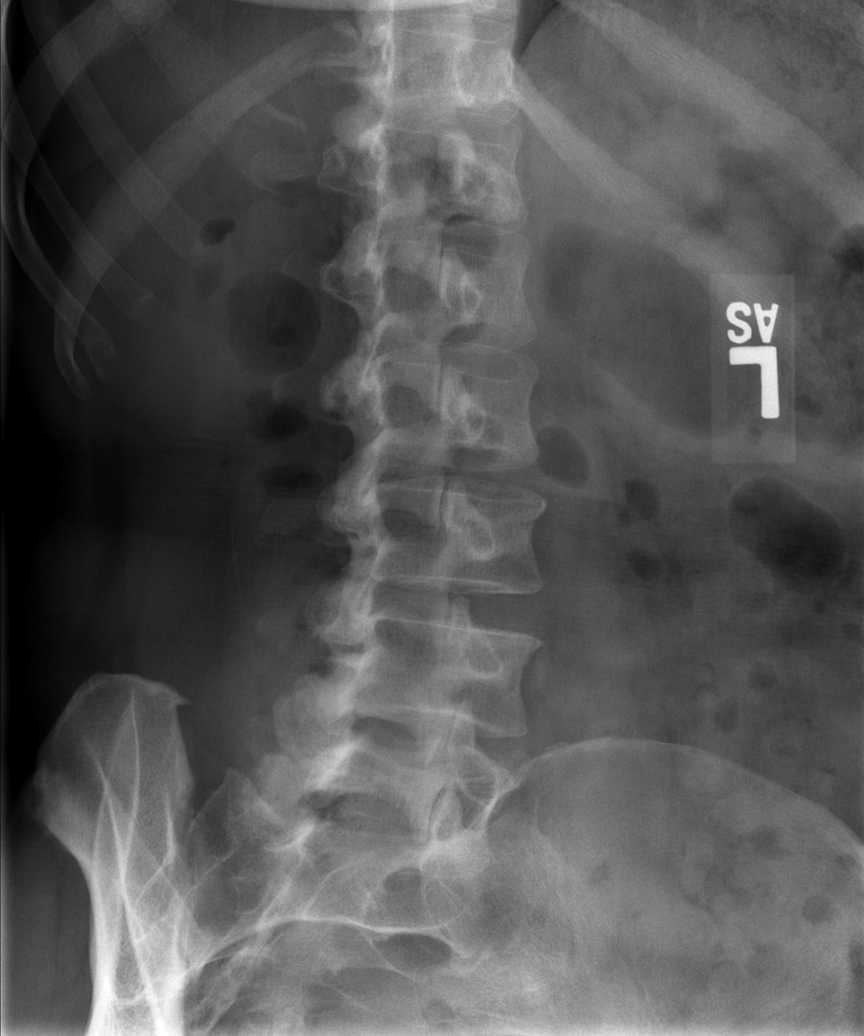

[t l-spine lat]
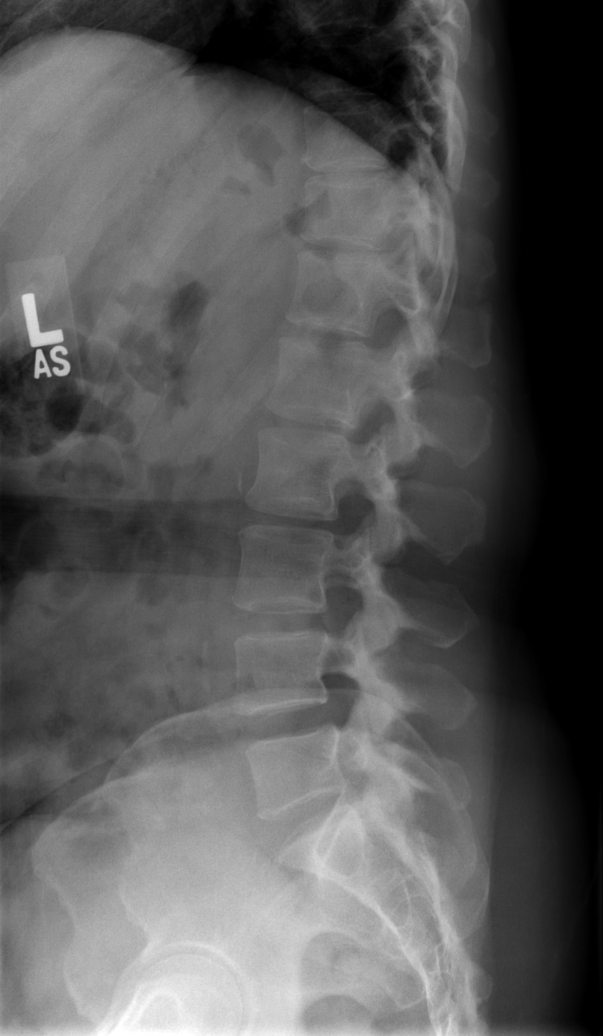

[t l-spine l5-s1 spot]
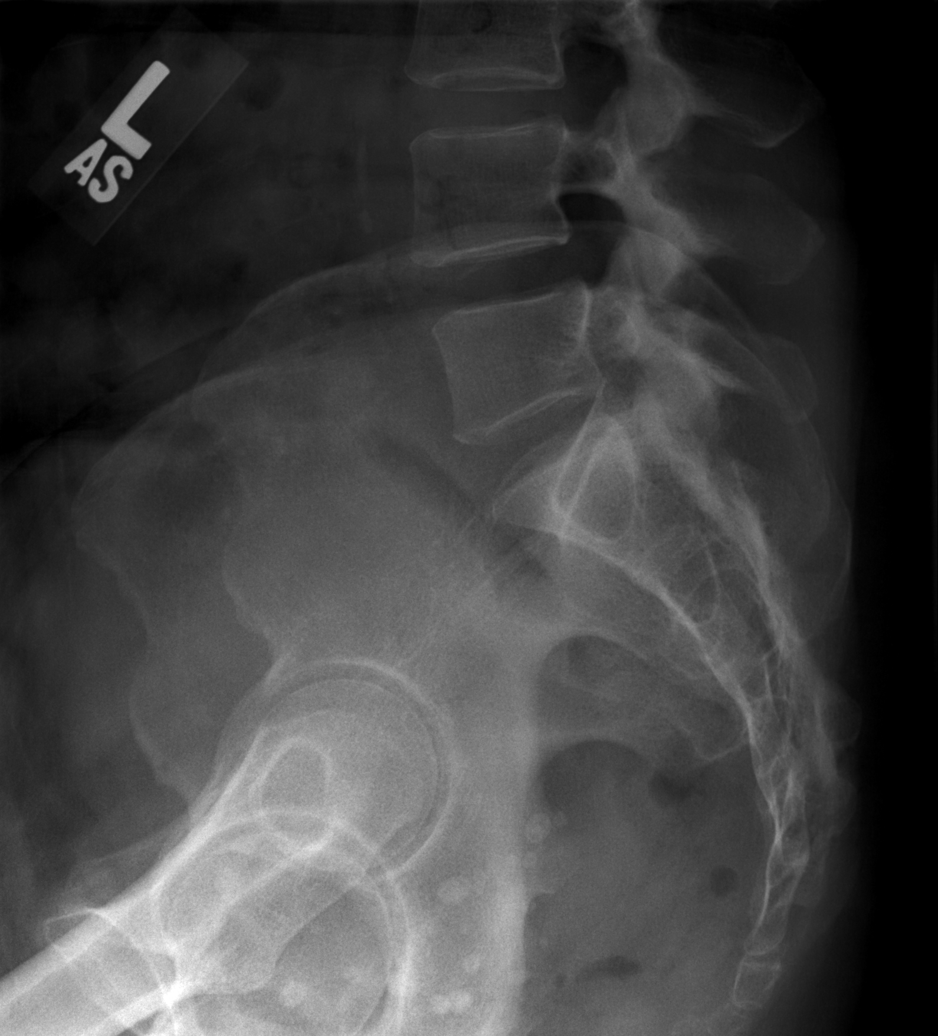

[5 of 5 positions shown; findings below may reference images not displayed]

FINDINGS: The twelfth ribs are hypoplastic. The lumbar vertebral bodies are
preserved in height. The disc space heights are well maintained.
There is no spondylolisthesis. There is no significant facet joint
hypertrophy. The pedicles and transverse processes are intact. The
observed portions of the sacrum are normal. There is calcification
in the wall of the abdominal aorta. There are numerous pelvic
phleboliths. The bowel gas pattern is unremarkable.
IMPRESSION: There is no acute or significant chronic bony abnormality of the
lumbar spine. Given the persistent radicular symptoms, lumbar spine
MRI would be a useful next imaging step.

Abdominal aortic atherosclerosis.

## 2018-08-19 ENCOUNTER — Other Ambulatory Visit: Payer: BLUE CROSS/BLUE SHIELD

## 2018-08-22 ENCOUNTER — Ambulatory Visit: Payer: BLUE CROSS/BLUE SHIELD | Admitting: Nurse Practitioner

## 2019-09-25 ENCOUNTER — Other Ambulatory Visit: Payer: Self-pay | Admitting: Internal Medicine

## 2019-09-25 DIAGNOSIS — Z1231 Encounter for screening mammogram for malignant neoplasm of breast: Secondary | ICD-10-CM

## 2019-10-16 ENCOUNTER — Ambulatory Visit
Admission: RE | Admit: 2019-10-16 | Discharge: 2019-10-16 | Disposition: A | Discharge status: Home / Self Care | Payer: BLUE CROSS/BLUE SHIELD | Source: Ambulatory Visit | Attending: Internal Medicine | Admitting: Internal Medicine

## 2019-10-16 ENCOUNTER — Other Ambulatory Visit: Payer: Self-pay

## 2019-10-16 DIAGNOSIS — Z1231 Encounter for screening mammogram for malignant neoplasm of breast: Secondary | ICD-10-CM

## 2020-06-04 ENCOUNTER — Encounter: Payer: Self-pay | Admitting: Nurse Practitioner

## 2020-06-04 ENCOUNTER — Ambulatory Visit: Payer: Self-pay | Attending: Nurse Practitioner | Admitting: Nurse Practitioner

## 2020-06-04 ENCOUNTER — Other Ambulatory Visit: Payer: Self-pay

## 2020-06-04 VITALS — BP 145/76 | HR 67 | Ht 62.0 in | Wt 160.0 lb

## 2020-06-04 DIAGNOSIS — Z1159 Encounter for screening for other viral diseases: Secondary | ICD-10-CM

## 2020-06-04 DIAGNOSIS — Z1329 Encounter for screening for other suspected endocrine disorder: Secondary | ICD-10-CM

## 2020-06-04 DIAGNOSIS — D72829 Elevated white blood cell count, unspecified: Secondary | ICD-10-CM

## 2020-06-04 DIAGNOSIS — R7309 Other abnormal glucose: Secondary | ICD-10-CM

## 2020-06-04 DIAGNOSIS — Z7689 Persons encountering health services in other specified circumstances: Secondary | ICD-10-CM

## 2020-06-04 DIAGNOSIS — N939 Abnormal uterine and vaginal bleeding, unspecified: Secondary | ICD-10-CM

## 2020-06-04 DIAGNOSIS — E785 Hyperlipidemia, unspecified: Secondary | ICD-10-CM

## 2020-06-04 NOTE — Progress Notes (Signed)
Assessment & Plan:  Stori was seen today for new patient (initial visit).  Diagnoses and all orders for this visit:  Encounter to establish care  Leukocytosis, unspecified type -     CBC with Differential  Need for hepatitis C screening test -     HCV Ab w Reflex to Quant PCR  Elevated glucose -     CMP14+EGFR -     Hemoglobin A1c  Dyslipidemia, goal LDL below 100 -     Lipid panel INSTRUCTIONS: Work on a low fat, heart healthy diet and participate in regular aerobic exercise program by working out at least 150 minutes per week; 5 days a week-30 minutes per day. Avoid red meat/beef/steak,  fried foods. junk foods, sodas, sugary drinks, unhealthy snacking, alcohol and smoking.  Drink at least 80 oz of water per day and monitor your carbohydrate intake daily.   Thyroid disorder screening -     TSH  AUB Schedule PAP SMEAR   Patient has been counseled on age-appropriate routine health concerns for screening and prevention. These are reviewed and up-to-date. Referrals have been placed accordingly. Immunizations are up-to-date or declined.    Subjective:   Chief Complaint  Patient presents with  . New Patient (Initial Visit)   HPI Saoirse E Mentel 60 y.o. female presents to office today to establish care She has a past medical history of Anemia and Anemia.  Last menstrual cycle 2 years ago. Started Spotting a few days ago. Has not been sexually active.  Last PAP smear 2016 was normal. She has not had a pap smear since then. Associated symptoms: abdominal cramping  Chronic Low back pain Does not wish to take any prescribed medications. She is doing reflexology for back pain.    Essential Hypertension Not well controlled. She would like to work on diet and exercise. Will have her return and may need to start antihypertensives.  BP Readings from Last 3 Encounters:  06/04/20 (!) 145/76  02/21/18 (!) 150/80  01/21/18 122/84    Review of Systems  Constitutional:  Negative for fever, malaise/fatigue and weight loss.  HENT: Negative.  Negative for nosebleeds.   Eyes: Negative.  Negative for blurred vision, double vision and photophobia.  Respiratory: Negative.  Negative for cough and shortness of breath.   Cardiovascular: Negative.  Negative for chest pain, palpitations and leg swelling.  Gastrointestinal: Negative.  Negative for heartburn, nausea and vomiting.  Genitourinary:       SEE HPI  Musculoskeletal: Negative.  Negative for myalgias.  Neurological: Negative.  Negative for dizziness, focal weakness, seizures and headaches.  Psychiatric/Behavioral: Negative.  Negative for suicidal ideas.    Past Medical History:  Diagnosis Date  . Anemia   . Anemia     Past Surgical History:  Procedure Laterality Date  . ABDOMINAL HYSTERECTOMY  1990  . COLON SURGERY  2005   [facility]    Family History  Problem Relation Age of Onset  . Cancer Brother   . Lung cancer Brother   . Lung cancer Maternal Grandfather   . Pancreatic cancer Paternal Grandmother     Social History Reviewed with no changes to be made today.   Outpatient Medications Prior to Visit  Medication Sig Dispense Refill  . Biotin 5 MG CAPS Take 1 capsule by mouth daily.    . Famotidine (PEPCID PO) Take by mouth. As needed for acid reflux    . ferrous gluconate (FERGON) 325 MG tablet Take 325 mg by mouth daily with breakfast.    .  ibuprofen (ADVIL,MOTRIN) 800 MG tablet Take 1 tablet (800 mg total) by mouth every 8 (eight) hours as needed. 21 tablet 0  . magnesium gluconate (MAGONATE) 500 MG tablet Take 500 mg by mouth daily.    . Multiple Vitamins-Minerals (CENTRUM SILVER ULTRA WOMENS) TABS Take 1 tablet by mouth daily.    . mirabegron ER (MYRBETRIQ) 25 MG TB24 tablet Take 1 tablet (25 mg total) by mouth daily. (Patient not taking: Reported on 06/04/2020) 30 tablet 1   No facility-administered medications prior to visit.    Allergies  Allergen Reactions  . Acetaminophen  Other (See Comments)    ulcers       Objective:    BP (!) 145/76   Pulse 67   Ht 5' 2"  (1.575 m)   Wt 160 lb (72.6 kg)   LMP 09/09/2014   SpO2 98%   BMI 29.26 kg/m  Wt Readings from Last 3 Encounters:  06/04/20 160 lb (72.6 kg)  02/21/18 166 lb 3.2 oz (75.4 kg)  01/21/18 171 lb 6.4 oz (77.7 kg)    Physical Exam Vitals and nursing note reviewed.  Constitutional:      Appearance: She is well-developed.  HENT:     Head: Normocephalic and atraumatic.  Cardiovascular:     Rate and Rhythm: Normal rate and regular rhythm.     Heart sounds: Normal heart sounds. No murmur heard. No friction rub. No gallop.   Pulmonary:     Effort: Pulmonary effort is normal. No tachypnea or respiratory distress.     Breath sounds: Normal breath sounds. No decreased breath sounds, wheezing, rhonchi or rales.  Chest:     Chest wall: No tenderness.  Abdominal:     General: Bowel sounds are normal.     Palpations: Abdomen is soft.  Musculoskeletal:        General: Normal range of motion.     Cervical back: Normal range of motion.  Skin:    General: Skin is warm and dry.  Neurological:     Mental Status: She is alert and oriented to person, place, and time.     Coordination: Coordination normal.  Psychiatric:        Behavior: Behavior normal. Behavior is cooperative.        Thought Content: Thought content normal.        Judgment: Judgment normal.          Patient has been counseled extensively about nutrition and exercise as well as the importance of adherence with medications and regular follow-up. The patient was given clear instructions to go to ER or return to medical center if symptoms don't improve, worsen or new problems develop. The patient verbalized understanding.   Follow-up: Return for PAP SMEAR  on a friday morning. can double book for 830.   Gildardo Pounds, FNP-BC [facility] and [facility] [place], [place]   06/05/2020, 3:59 PM

## 2020-06-05 ENCOUNTER — Encounter: Payer: Self-pay | Admitting: Nurse Practitioner

## 2020-06-05 LAB — CMP14+EGFR
ALT: 26 IU/L (ref 0–32)
AST: 28 IU/L (ref 0–40)
Albumin/Globulin Ratio: 1.8 (ref 1.2–2.2)
Albumin: 4.7 g/dL (ref 3.8–4.9)
Alkaline Phosphatase: 90 IU/L (ref 44–121)
BUN/Creatinine Ratio: 13 (ref 9–23)
BUN: 10 mg/dL (ref 6–24)
Bilirubin Total: 0.4 mg/dL (ref 0.0–1.2)
CO2: 19 mmol/L — ABNORMAL LOW (ref 20–29)
Calcium: 9.3 mg/dL (ref 8.7–10.2)
Chloride: 102 mmol/L (ref 96–106)
Creatinine, Ser: 0.76 mg/dL (ref 0.57–1.00)
Globulin, Total: 2.6 g/dL (ref 1.5–4.5)
Glucose: 92 mg/dL (ref 65–99)
Potassium: 4.3 mmol/L (ref 3.5–5.2)
Sodium: 139 mmol/L (ref 134–144)
Total Protein: 7.3 g/dL (ref 6.0–8.5)
eGFR: 91 mL/min/{1.73_m2} (ref >59)

## 2020-06-05 LAB — CBC WITH DIFFERENTIAL/PLATELET
Basophils Absolute: 0 10*3/uL (ref 0.0–0.2)
Basos: 1 %
EOS (ABSOLUTE): 0 10*3/uL (ref 0.0–0.4)
Eos: 1 %
Hematocrit: 41.1 % (ref 34.0–46.6)
Hemoglobin: 14.2 g/dL (ref 11.1–15.9)
Immature Grans (Abs): 0 10*3/uL (ref 0.0–0.1)
Immature Granulocytes: 0 %
Lymphocytes Absolute: 1.4 10*3/uL (ref 0.7–3.1)
Lymphs: 38 %
MCH: 32.8 pg (ref 26.6–33.0)
MCHC: 34.5 g/dL (ref 31.5–35.7)
MCV: 95 fL (ref 79–97)
Monocytes Absolute: 0.3 10*3/uL (ref 0.1–0.9)
Monocytes: 8 %
Neutrophils Absolute: 1.9 10*3/uL (ref 1.4–7.0)
Neutrophils: 52 %
Platelets: 346 10*3/uL (ref 150–450)
RBC: 4.33 x10E6/uL (ref 3.77–5.28)
RDW: 13 % (ref 11.7–15.4)
WBC: 3.6 10*3/uL (ref 3.4–10.8)

## 2020-06-05 LAB — LIPID PANEL
Chol/HDL Ratio: 3.5 ratio (ref 0.0–4.4)
Cholesterol, Total: 250 mg/dL — ABNORMAL HIGH (ref 100–199)
HDL: 71 mg/dL (ref >39)
LDL Chol Calc (NIH): 169 mg/dL — ABNORMAL HIGH (ref 0–99)
Triglycerides: 64 mg/dL (ref 0–149)
VLDL Cholesterol Cal: 10 mg/dL (ref 5–40)

## 2020-06-05 LAB — HCV AB W REFLEX TO QUANT PCR: HCV Ab: <0.1 s/co ratio (ref 0.0–0.9)

## 2020-06-05 LAB — HEMOGLOBIN A1C
Est. average glucose Bld gHb Est-mCnc: 120 mg/dL
Hgb A1c MFr Bld: 5.8 % — ABNORMAL HIGH (ref 4.8–5.6)

## 2020-06-05 LAB — HCV INTERPRETATION

## 2020-06-05 LAB — TSH: TSH: 1.12 u[IU]/mL (ref 0.450–4.500)

## 2020-06-18 ENCOUNTER — Encounter: Payer: Self-pay | Admitting: Nurse Practitioner

## 2020-06-18 ENCOUNTER — Other Ambulatory Visit: Payer: Self-pay

## 2020-06-18 ENCOUNTER — Ambulatory Visit: Payer: Self-pay | Attending: Nurse Practitioner | Admitting: Nurse Practitioner

## 2020-06-18 VITALS — BP 162/73 | HR 79 | Ht 62.0 in | Wt 161.0 lb

## 2020-06-18 DIAGNOSIS — Z124 Encounter for screening for malignant neoplasm of cervix: Secondary | ICD-10-CM

## 2020-06-18 NOTE — Progress Notes (Signed)
Assessment & Plan:  Lauren Ruiz was seen today for gynecologic exam.  Diagnoses and all orders for this visit:  Encounter for Papanicolaou smear for cervical cancer screening -     Cytology - PAP -     Cervicovaginal ancillary only -     US PELVIC COMPLETE WITH TRANSVAGINAL; Future    Patient has been counseled on age-appropriate routine health concerns for screening and prevention. These are reviewed and up-to-date. Referrals have been placed accordingly. Immunizations are up-to-date or declined.    Subjective:   HPI Lauren Ruiz 60 y.o. female presents to office today for PAP smear. She has a history of post menopausal bleeding. Will order PAP today and schedule Pelvic US.   Review of Systems  Constitutional: Negative.  Negative for chills, fever, malaise/fatigue and weight loss.  Respiratory: Negative.  Negative for cough, shortness of breath and wheezing.   Cardiovascular: Negative.  Negative for chest pain, orthopnea and leg swelling.  Gastrointestinal: Negative for abdominal pain.  Genitourinary: Negative.  Negative for flank pain.  Skin: Negative.  Negative for rash.  Psychiatric/Behavioral: Negative for suicidal ideas.    Past Medical History:  Diagnosis Date  . Anemia   . Anemia     Past Surgical History:  Procedure Laterality Date  . ABDOMINAL HYSTERECTOMY  1990  . COLON SURGERY  2005   [facility]    Family History  Problem Relation Age of Onset  . Cancer Brother   . Lung cancer Brother   . Lung cancer Maternal Grandfather   . Pancreatic cancer Paternal Grandmother     Social History Reviewed with no changes to be made today.   Outpatient Medications Prior to Visit  Medication Sig Dispense Refill  . Biotin 5 MG CAPS Take 1 capsule by mouth daily.    . Famotidine (PEPCID PO) Take by mouth. As needed for acid reflux    . ferrous gluconate (FERGON) 325 MG tablet Take 325 mg by mouth daily with breakfast.    . ibuprofen (ADVIL,MOTRIN) 800 MG  tablet Take 1 tablet (800 mg total) by mouth every 8 (eight) hours as needed. 21 tablet 0  . magnesium gluconate (MAGONATE) 500 MG tablet Take 500 mg by mouth daily.    . Multiple Vitamins-Minerals (CENTRUM SILVER ULTRA WOMENS) TABS Take 1 tablet by mouth daily.     No facility-administered medications prior to visit.    Allergies  Allergen Reactions  . Acetaminophen Other (See Comments)    ulcers       Objective:    BP (!) 162/73   Pulse 79   Ht 5\' 2"  (1.575 m)   Wt 161 lb (73 kg)   LMP 09/09/2014   SpO2 96%   BMI 29.45 kg/m  Wt Readings from Last 3 Encounters:  06/18/20 161 lb (73 kg)  06/04/20 160 lb (72.6 kg)  02/21/18 166 lb 3.2 oz (75.4 kg)    Physical Exam Exam conducted with a chaperone present.  Constitutional:      Appearance: She is well-developed.  HENT:     Head: Normocephalic.  Cardiovascular:     Rate and Rhythm: Normal rate and regular rhythm.     Heart sounds: Normal heart sounds.  Pulmonary:     Effort: Pulmonary effort is normal.     Breath sounds: Normal breath sounds.  Abdominal:     General: Bowel sounds are normal.     Palpations: Abdomen is soft.     Hernia: There is no hernia in the  left inguinal area.  Genitourinary:    Exam position: Lithotomy position.     Labia:        Right: No rash, tenderness, lesion or injury.        Left: No rash, tenderness, lesion or injury.      Vagina: Normal. No signs of injury and foreign body. No vaginal discharge, erythema, tenderness or bleeding.     Cervix: No cervical motion tenderness or friability.     Uterus: Not deviated and not enlarged.      Adnexa:        Right: No mass, tenderness or fullness.         Left: No mass, tenderness or fullness.       Rectum: Normal. No external hemorrhoid.  Lymphadenopathy:     Lower Body: No right inguinal adenopathy. No left inguinal adenopathy.  Skin:    General: Skin is warm and dry.  Neurological:     Mental Status: She is alert and oriented to person,  place, and time.  Psychiatric:        Behavior: Behavior normal.        Thought Content: Thought content normal.        Judgment: Judgment normal.          Patient has been counseled extensively about nutrition and exercise as well as the importance of adherence with medications and regular follow-up. The patient was given clear instructions to go to ER or return to medical center if symptoms don't improve, worsen or new problems develop. The patient verbalized understanding.   Follow-up: Return if symptoms worsen or fail to improve, for NEEDS ORANGE CARD/CAFA APP.   Claiborne Rigg, FNP-BC [facility] and [facility] [place], [place] [phone]   06/18/2020, 10:22 AM

## 2020-06-21 LAB — CERVICOVAGINAL ANCILLARY ONLY
Bacterial Vaginitis (gardnerella): POSITIVE — AB
Candida Glabrata: NEGATIVE
Candida Vaginitis: NEGATIVE
Chlamydia: NEGATIVE
Comment: NEGATIVE
Comment: NEGATIVE
Comment: NEGATIVE
Comment: NEGATIVE
Comment: NEGATIVE
Comment: NORMAL
Neisseria Gonorrhea: NEGATIVE
Trichomonas: NEGATIVE

## 2020-06-22 ENCOUNTER — Other Ambulatory Visit: Payer: Self-pay | Admitting: Nurse Practitioner

## 2020-06-22 LAB — CYTOLOGY - PAP
Adequacy: ABSENT
Comment: NEGATIVE
Diagnosis: NEGATIVE
High risk HPV: NEGATIVE

## 2020-06-22 MED ORDER — METRONIDAZOLE 500 MG PO TABS: 500.0000 mg | ORAL_TABLET | Freq: Two times a day (BID) | ORAL | 0 refills | Status: AC

## 2022-05-08 ENCOUNTER — Ambulatory Visit: Payer: BLUE CROSS/BLUE SHIELD | Admitting: Nurse Practitioner

## 2022-05-23 ENCOUNTER — Ambulatory Visit: Payer: BC Managed Care – PPO | Attending: Nurse Practitioner | Admitting: Nurse Practitioner

## 2022-05-23 ENCOUNTER — Encounter: Payer: Self-pay | Admitting: Nurse Practitioner

## 2022-05-23 ENCOUNTER — Ambulatory Visit
Admission: RE | Admit: 2022-05-23 | Discharge: 2022-05-23 | Disposition: A | Discharge status: Home / Self Care | Payer: BC Managed Care – PPO | Source: Ambulatory Visit | Attending: Nurse Practitioner | Admitting: Nurse Practitioner

## 2022-05-23 VITALS — BP 151/87 | HR 71 | Ht 62.0 in | Wt 158.6 lb

## 2022-05-23 DIAGNOSIS — Z860100 Personal history of colon polyps, unspecified: Secondary | ICD-10-CM | POA: Insufficient documentation

## 2022-05-23 DIAGNOSIS — B351 Tinea unguium: Secondary | ICD-10-CM

## 2022-05-23 DIAGNOSIS — K219 Gastro-esophageal reflux disease without esophagitis: Secondary | ICD-10-CM

## 2022-05-23 DIAGNOSIS — M5442 Lumbago with sciatica, left side: Secondary | ICD-10-CM | POA: Diagnosis not present

## 2022-05-23 DIAGNOSIS — I10 Essential (primary) hypertension: Secondary | ICD-10-CM

## 2022-05-23 DIAGNOSIS — E785 Hyperlipidemia, unspecified: Secondary | ICD-10-CM

## 2022-05-23 DIAGNOSIS — N3281 Overactive bladder: Secondary | ICD-10-CM | POA: Diagnosis not present

## 2022-05-23 DIAGNOSIS — R7303 Prediabetes: Secondary | ICD-10-CM

## 2022-05-23 DIAGNOSIS — E559 Vitamin D deficiency, unspecified: Secondary | ICD-10-CM

## 2022-05-23 DIAGNOSIS — M5441 Lumbago with sciatica, right side: Secondary | ICD-10-CM | POA: Diagnosis not present

## 2022-05-23 DIAGNOSIS — D72829 Elevated white blood cell count, unspecified: Secondary | ICD-10-CM

## 2022-05-23 DIAGNOSIS — H538 Other visual disturbances: Secondary | ICD-10-CM

## 2022-05-23 DIAGNOSIS — F5101 Primary insomnia: Secondary | ICD-10-CM

## 2022-05-23 DIAGNOSIS — G8929 Other chronic pain: Secondary | ICD-10-CM

## 2022-05-23 DIAGNOSIS — Z1231 Encounter for screening mammogram for malignant neoplasm of breast: Secondary | ICD-10-CM

## 2022-05-23 MED ORDER — AMLODIPINE BESYLATE 10 MG PO TABS
10.0000 mg | ORAL_TABLET | Freq: Every day | ORAL | 1 refills | Status: AC
Start: 2022-05-23 — End: ?

## 2022-05-23 MED ORDER — TRAZODONE HCL 100 MG PO TABS
100.0000 mg | ORAL_TABLET | Freq: Every day | ORAL | 0 refills | Status: DC
Start: 2022-05-23 — End: 2022-08-21

## 2022-05-23 MED ORDER — MELOXICAM 7.5 MG PO TABS
7.5000 mg | ORAL_TABLET | Freq: Every day | ORAL | 1 refills | Status: AC
Start: 2022-05-23 — End: ?

## 2022-05-23 NOTE — Progress Notes (Signed)
Patient states she is have trouble staying asleep.  Tingling in hands and feet. Big toe numbness on right foot. Random dizzy spells.   Only tubal ligation no hysterectomy

## 2022-05-23 NOTE — Progress Notes (Signed)
Assessment & Plan:  Lauren Ruiz was seen today for hot flashes and urinary frequency.  Diagnoses and all orders for this visit:  Primary hypertension Continue all antihypertensives as prescribed.  Reminded to bring in blood pressure log for follow  up appointment.  RECOMMENDATIONS: DASH/Mediterranean Diets are healthier choices for HTN.   -     amLODipine (NORVASC) 10 MG tablet; Take 1 tablet (10 mg total) by mouth daily. FOR BLOOD PRESSURE  OAB (overactive bladder) -     Urinalysis, Complete  Chronic right-sided low back pain with bilateral sciatica -     DG Lumbar Spine Complete; Future -     meloxicam (MOBIC) 7.5 MG tablet; Take 1 tablet (7.5 mg total) by mouth daily. For back pain -     Ambulatory referral to Orthopedics  GERD without esophagitis -     Ambulatory referral to Gastroenterology INSTRUCTIONS: Avoid GERD Triggers: acidic, spicy or fried foods, caffeine, coffee, sodas,  alcohol and chocolate.    Primary insomnia -     traZODone (DESYREL) 100 MG tablet; Take 1 tablet (100 mg total) by mouth at bedtime. For insomnia  Onychomycosis of right great toe -     Ambulatory referral to Podiatry  Prediabetes -     CMP14+EGFR -     Thyroid Panel With TSH -     Hemoglobin A1c  Breast cancer screening by mammogram -     MM 3D SCREENING MAMMOGRAM BILATERAL BREAST; Future  Dyslipidemia, goal LDL below 100 -     Lipid panel  Leukocytosis, unspecified type -     CBC with Differential  Vision blurred -     Ambulatory referral to Ophthalmology  Vitamin D deficiency disease -     VITAMIN D 25 Hydroxy (Vit-D Deficiency, Fractures)    Patient has been counseled on age-appropriate routine health concerns for screening and prevention. These are reviewed and up-to-date. Referrals have been placed accordingly. Immunizations are up-to-date or declined.    Subjective:   Chief Complaint  Patient presents with   Hot Flashes   Urinary Frequency   HPI Lauren Ruiz 62 y.o.  female presents to office today for follow up to elevated BP readings. I have not seen her in over 2 years.   Blood pressure is not controlled. Will need to start antihypertensive today. She reluctantly agrees to this.  BP Readings from Last 3 Encounters:  05/23/22 (!) 151/87  06/18/20 (!) 162/73  06/04/20 (!) 145/76   Chronic low back pain with right sided sciatica She had been seen in 2017 by Dr reed due to right sided back pain with radiation down right leg, lumbar spine with significant abnormalities and MRI was recommended however she never went to get this due to insurance issues.  Several years ago she fell on black ice and hit right side of her body. . Pain described as strain, sore, aching.  Reports shooting, throbbing pain down the right leg. Takes advil occasionally. Does not like to take medications and prefers conservative therapies.  No symptoms of cauda equina   OAB Reports frequent urination- no loss of control of bladder but some leakage with coughing or sneezing and this is not frequently. Night is the worse but frequent urination throughout the day. Holds urine a lot because she has to go a lot. Attempts to drink a lot of water- 48-64 oz a day. Has coffee once daily but no other caffeine products. She has gone to restroom already 5-6 times this morning.. Was  presribed mirabegron in the past but did not take. No abdominal pain or discomfort but does believe she has a small umbilical hernia.  Hot flashes Worse at night. She declines SSRI or gabapentin today.   Primary insomnia Has difficulty falling and staying asleep. Has tried sleepy time tea in the past. Recently purchased a new tea for insomnia online.  GERD: Paitent complains of heartburn. This has been associated with midespigastric pain, upper abdominal discomfort, and burning in stomach .  She denies chest pain, hematemesis, hoarseness, melena, and unexpected weight loss. Symptoms have been present for several months.  She denies dysphagia.  She has not lost weight. She denies melena, hematochezia, hematemesis, and coffee ground emesis. Medical therapy in the past has included antacids, H2 antagonists, and proton pump inhibitors. Currently taking prilosec. Has taken zantac and famotidine in the past     Review of Systems  Constitutional:  Negative for fever, malaise/fatigue and weight loss.  HENT: Negative.  Negative for nosebleeds.   Eyes:  Positive for blurred vision. Negative for double vision, photophobia, pain, discharge and redness.  Respiratory: Negative.  Negative for cough and shortness of breath.   Cardiovascular: Negative.  Negative for chest pain, palpitations and leg swelling.  Gastrointestinal:  Positive for heartburn. Negative for abdominal pain, blood in stool, constipation, diarrhea, melena, nausea and vomiting.  Genitourinary:  Positive for frequency and urgency. Negative for dysuria, flank pain and hematuria.  Musculoskeletal:  Positive for back pain. Negative for myalgias.  Skin:        Onychomycosis of bilateral great toes R>L  Neurological: Negative.  Negative for dizziness, focal weakness, seizures and headaches.  Psychiatric/Behavioral:  Negative for suicidal ideas. The patient has insomnia.     Past Medical History:  Diagnosis Date   Anemia    Anemia     Past Surgical History:  Procedure Laterality Date   COLON SURGERY  01/17/2003   [facility]    Family History  Problem Relation Age of Onset   Cancer Brother    Lung cancer Brother    Lung cancer Maternal Grandfather    Pancreatic cancer Paternal Grandmother     Social History Reviewed with no changes to be made today.   Outpatient Medications Prior to Visit  Medication Sig Dispense Refill   Biotin 5 MG CAPS Take 1 capsule by mouth daily.     Famotidine (PEPCID PO) Take by mouth. As needed for acid reflux     ferrous gluconate (FERGON) 325 MG tablet Take 325 mg by mouth daily with breakfast.     ibuprofen  (ADVIL,MOTRIN) 800 MG tablet Take 1 tablet (800 mg total) by mouth every 8 (eight) hours as needed. 21 tablet 0   magnesium gluconate (MAGONATE) 500 MG tablet Take 500 mg by mouth daily.     Multiple Vitamins-Minerals (CENTRUM SILVER ULTRA WOMENS) TABS Take 1 tablet by mouth daily.     No facility-administered medications prior to visit.    Allergies  Allergen Reactions   Acetaminophen Other (See Comments)    ulcers       Objective:    BP (!) 151/87   Pulse 71   Ht 5\' 2"  (1.575 m)   Wt 158 lb 9.6 oz (71.9 kg)   LMP 09/09/2014   SpO2 96%   BMI 29.01 kg/m  Wt Readings from Last 3 Encounters:  05/23/22 158 lb 9.6 oz (71.9 kg)  06/18/20 161 lb (73 kg)  06/04/20 160 lb (72.6 kg)    Physical Exam  Patient has been counseled extensively about nutrition and exercise as well as the importance of adherence with medications and regular follow-up. The patient was given clear instructions to go to ER or return to medical center if symptoms don't improve, worsen or new problems develop. The patient verbalized understanding.   Follow-up: Return in about 4 weeks (around 06/20/2022) for BP recheck.   Claiborne Rigg, FNP-BC [facility] and [facility] [place], [place] [phone]   05/23/2022, 12:09 PM

## 2022-05-24 LAB — LIPID PANEL
Chol/HDL Ratio: 3 ratio (ref 0.0–4.4)
Cholesterol, Total: 249 mg/dL — ABNORMAL HIGH (ref 100–199)
HDL: 84 mg/dL (ref >39)
LDL Chol Calc (NIH): 152 mg/dL — ABNORMAL HIGH (ref 0–99)
Triglycerides: 80 mg/dL (ref 0–149)
VLDL Cholesterol Cal: 13 mg/dL (ref 5–40)

## 2022-05-24 LAB — CMP14+EGFR
ALT: 22 IU/L (ref 0–32)
AST: 21 IU/L (ref 0–40)
Albumin/Globulin Ratio: 1.7 (ref 1.2–2.2)
Albumin: 4.5 g/dL (ref 3.8–4.9)
Alkaline Phosphatase: 89 IU/L (ref 44–121)
BUN/Creatinine Ratio: 24 (ref 12–28)
BUN: 16 mg/dL (ref 8–27)
Bilirubin Total: 0.3 mg/dL (ref 0.0–1.2)
CO2: 21 mmol/L (ref 20–29)
Calcium: 9.8 mg/dL (ref 8.7–10.3)
Chloride: 103 mmol/L (ref 96–106)
Creatinine, Ser: 0.68 mg/dL (ref 0.57–1.00)
Globulin, Total: 2.6 g/dL (ref 1.5–4.5)
Glucose: 90 mg/dL (ref 70–99)
Potassium: 4.2 mmol/L (ref 3.5–5.2)
Sodium: 141 mmol/L (ref 134–144)
Total Protein: 7.1 g/dL (ref 6.0–8.5)
eGFR: 100 mL/min/{1.73_m2} (ref >59)

## 2022-05-24 LAB — URINALYSIS, COMPLETE
Bilirubin, UA: NEGATIVE
Glucose, UA: NEGATIVE
Ketones, UA: NEGATIVE
Leukocytes,UA: NEGATIVE
Nitrite, UA: NEGATIVE
Protein,UA: NEGATIVE
RBC, UA: NEGATIVE
Specific Gravity, UA: 1.021 (ref 1.005–1.030)
Urobilinogen, Ur: 0.2 mg/dL (ref 0.2–1.0)
pH, UA: 5.5 (ref 5.0–7.5)

## 2022-05-24 LAB — CBC WITH DIFFERENTIAL/PLATELET
Basophils Absolute: 0 10*3/uL (ref 0.0–0.2)
Basos: 0 %
EOS (ABSOLUTE): 0 10*3/uL (ref 0.0–0.4)
Eos: 1 %
Hematocrit: 43.5 % (ref 34.0–46.6)
Hemoglobin: 14.5 g/dL (ref 11.1–15.9)
Immature Grans (Abs): 0 10*3/uL (ref 0.0–0.1)
Immature Granulocytes: 0 %
Lymphocytes Absolute: 1.9 10*3/uL (ref 0.7–3.1)
Lymphs: 40 %
MCH: 31.3 pg (ref 26.6–33.0)
MCHC: 33.3 g/dL (ref 31.5–35.7)
MCV: 94 fL (ref 79–97)
Monocytes Absolute: 0.4 10*3/uL (ref 0.1–0.9)
Monocytes: 9 %
Neutrophils Absolute: 2.4 10*3/uL (ref 1.4–7.0)
Neutrophils: 50 %
Platelets: 334 10*3/uL (ref 150–450)
RBC: 4.63 x10E6/uL (ref 3.77–5.28)
RDW: 12.8 % (ref 11.7–15.4)
WBC: 4.8 10*3/uL (ref 3.4–10.8)

## 2022-05-24 LAB — MICROSCOPIC EXAMINATION
Bacteria, UA: NONE SEEN
Casts: NONE SEEN /lpf
WBC, UA: NONE SEEN /hpf (ref 0–5)

## 2022-05-24 LAB — HEMOGLOBIN A1C
Est. average glucose Bld gHb Est-mCnc: 131 mg/dL
Hgb A1c MFr Bld: 6.2 % — ABNORMAL HIGH (ref 4.8–5.6)

## 2022-05-24 LAB — VITAMIN D 25 HYDROXY (VIT D DEFICIENCY, FRACTURES): Vit D, 25-Hydroxy: 38.4 ng/mL (ref 30.0–100.0)

## 2022-05-24 LAB — THYROID PANEL WITH TSH
Free Thyroxine Index: 1.6 (ref 1.2–4.9)
T3 Uptake Ratio: 25 % (ref 24–39)
T4, Total: 6.2 ug/dL (ref 4.5–12.0)
TSH: 1.61 u[IU]/mL (ref 0.450–4.500)

## 2022-05-25 ENCOUNTER — Other Ambulatory Visit: Payer: Self-pay | Admitting: Nurse Practitioner

## 2022-05-25 DIAGNOSIS — Z1231 Encounter for screening mammogram for malignant neoplasm of breast: Secondary | ICD-10-CM

## 2022-05-30 ENCOUNTER — Other Ambulatory Visit: Payer: Self-pay | Admitting: Nurse Practitioner

## 2022-05-30 DIAGNOSIS — G8929 Other chronic pain: Secondary | ICD-10-CM

## 2022-05-30 MED ORDER — MIRABEGRON ER 25 MG PO TB24: 25.0000 mg | ORAL_TABLET | Freq: Every day | ORAL | 1 refills | Status: AC

## 2022-06-05 ENCOUNTER — Ambulatory Visit: Payer: BC Managed Care – PPO | Admitting: Physician Assistant

## 2022-06-05 ENCOUNTER — Ambulatory Visit
Admission: RE | Admit: 2022-06-05 | Discharge: 2022-06-05 | Disposition: A | Discharge status: Home / Self Care | Payer: BC Managed Care – PPO | Source: Ambulatory Visit | Attending: Nurse Practitioner | Admitting: Nurse Practitioner

## 2022-06-05 DIAGNOSIS — Z1231 Encounter for screening mammogram for malignant neoplasm of breast: Secondary | ICD-10-CM

## 2022-06-07 ENCOUNTER — Other Ambulatory Visit: Payer: Self-pay | Admitting: Nurse Practitioner

## 2022-06-07 DIAGNOSIS — R928 Other abnormal and inconclusive findings on diagnostic imaging of breast: Secondary | ICD-10-CM

## 2022-06-19 ENCOUNTER — Other Ambulatory Visit: Payer: Self-pay | Admitting: Podiatry

## 2022-06-19 ENCOUNTER — Ambulatory Visit (INDEPENDENT_AMBULATORY_CARE_PROVIDER_SITE_OTHER): Payer: BC Managed Care – PPO

## 2022-06-19 ENCOUNTER — Ambulatory Visit (INDEPENDENT_AMBULATORY_CARE_PROVIDER_SITE_OTHER): Payer: BC Managed Care – PPO | Admitting: Podiatry

## 2022-06-19 VITALS — Ht 63.0 in

## 2022-06-19 DIAGNOSIS — M79671 Pain in right foot: Secondary | ICD-10-CM

## 2022-06-19 DIAGNOSIS — M792 Neuralgia and neuritis, unspecified: Secondary | ICD-10-CM | POA: Diagnosis not present

## 2022-06-19 DIAGNOSIS — M7751 Other enthesopathy of right foot: Secondary | ICD-10-CM | POA: Diagnosis not present

## 2022-06-19 DIAGNOSIS — B351 Tinea unguium: Secondary | ICD-10-CM

## 2022-06-19 NOTE — Progress Notes (Signed)
Subjective:   Patient ID: Lauren Ruiz, female   DOB: 62 y.o.   MRN: [id number]   HPI Chief Complaint  Patient presents with   Toe Pain    Right big toe numbness, nail thickness   62 year old female presents for above concerns.  She states that she has had nail fungus for quite some time.  Per review of the chart she was on Lamisil in 2016 and she states it may have helped a little bit as part of thickening.  Also states that she has tried some topical medication over-the-counter but she has not been consistent with it.  The nails are still the same.  No swelling, redness or any drainage.  She is also been having some persistent numbness to the right big toe.  Does not radiate.  No injuries.   Review of Systems  All other systems reviewed and are negative.  Past Medical History:  Diagnosis Date   Anemia    Anemia     Past Surgical History:  Procedure Laterality Date   COLON SURGERY  01/17/2003   [facility]     Current Outpatient Medications:    amLODipine (NORVASC) 10 MG tablet, Take 1 tablet (10 mg total) by mouth daily. FOR BLOOD PRESSURE, Disp: 90 tablet, Rfl: 1   Biotin 5 MG CAPS, Take 1 capsule by mouth daily., Disp: , Rfl:    Famotidine (PEPCID PO), Take by mouth. As needed for acid reflux, Disp: , Rfl:    ferrous gluconate (FERGON) 325 MG tablet, Take 325 mg by mouth daily with breakfast., Disp: , Rfl:    ibuprofen (ADVIL,MOTRIN) 800 MG tablet, Take 1 tablet (800 mg total) by mouth every 8 (eight) hours as needed., Disp: 21 tablet, Rfl: 0   magnesium gluconate (MAGONATE) 500 MG tablet, Take 500 mg by mouth daily., Disp: , Rfl:    meloxicam (MOBIC) 7.5 MG tablet, Take 1 tablet (7.5 mg total) by mouth daily. For back pain, Disp: 30 tablet, Rfl: 1   mirabegron ER (MYRBETRIQ) 25 MG TB24 tablet, Take 1 tablet (25 mg total) by mouth daily. For overactive bladder, Disp: 90 tablet, Rfl: 1   Multiple Vitamins-Minerals (CENTRUM SILVER ULTRA WOMENS) TABS, Take 1 tablet by  mouth daily., Disp: , Rfl:    traZODone (DESYREL) 100 MG tablet, Take 1 tablet (100 mg total) by mouth at bedtime. For insomnia, Disp: 90 tablet, Rfl: 0  Allergies  Allergen Reactions   Acetaminophen Other (See Comments)    ulcers          Objective:  Physical Exam  General: AAO x3, NAD  Dermatological: Bilateral hallux, second digit nails are hypertrophic, dystrophic with yellow discoloration.  No edema, erythema.  There are no open lesions.  Vascular: Dorsalis Pedis artery and Posterior Tibial artery pedal pulses are 2/4 bilateral with immedate capillary fill time.  There is no pain with calf compression, swelling, warmth, erythema.   Neruologic: Grossly intact via light touch bilateral. Negative tinel sign.   Musculoskeletal: No pain on exam.  Gait: Unassisted, Nonantalgic.       Assessment:   Onychomycosis, neuritis     Plan:  -Treatment options discussed including all alternatives, risks, and complications -Etiology of symptoms were discussed -X-rays were obtained and reviewed with the patient.  Spurring present off the medial aspect of the distal phalanx of the hallux.  No evidence of acute fracture. -I think the numbness is localized nerve compression.  Continue shoes, good arch support as well as shoes avoid any  excess pressure.  If symptoms worsen or start to spread consider nerve conduction test, further workup. -Debrided the nails without any complications or bleeding to the fungal nails as well as for culture. Will likely do oral medication.     Vivi Barrack DPM

## 2022-07-04 ENCOUNTER — Encounter: Payer: Self-pay | Admitting: Podiatry

## 2022-07-04 ENCOUNTER — Ambulatory Visit: Payer: BC Managed Care – PPO | Admitting: Nurse Practitioner

## 2022-07-14 ENCOUNTER — Ambulatory Visit
Admission: RE | Admit: 2022-07-14 | Discharge: 2022-07-14 | Disposition: A | Discharge status: Home / Self Care | Payer: BC Managed Care – PPO | Source: Ambulatory Visit | Attending: Nurse Practitioner | Admitting: Nurse Practitioner

## 2022-07-14 ENCOUNTER — Ambulatory Visit: Payer: BC Managed Care – PPO

## 2022-07-14 DIAGNOSIS — R928 Other abnormal and inconclusive findings on diagnostic imaging of breast: Secondary | ICD-10-CM

## 2022-08-11 ENCOUNTER — Encounter: Payer: Self-pay | Admitting: Nurse Practitioner

## 2022-08-21 ENCOUNTER — Other Ambulatory Visit: Payer: Self-pay | Admitting: Nurse Practitioner

## 2022-08-21 DIAGNOSIS — F5101 Primary insomnia: Secondary | ICD-10-CM

## 2022-10-26 ENCOUNTER — Ambulatory Visit: Payer: BC Managed Care – PPO | Admitting: Nurse Practitioner

## 2022-12-06 ENCOUNTER — Other Ambulatory Visit: Payer: Self-pay | Admitting: Medical Genetics

## 2022-12-06 DIAGNOSIS — Z006 Encounter for examination for normal comparison and control in clinical research program: Secondary | ICD-10-CM

## 2023-11-12 ENCOUNTER — Other Ambulatory Visit: Payer: Self-pay | Admitting: Medical Genetics

## 2023-11-12 DIAGNOSIS — Z006 Encounter for examination for normal comparison and control in clinical research program: Secondary | ICD-10-CM

## 2023-11-12 NOTE — Progress Notes (Signed)
gen

## 2023-12-25 LAB — GENECONNECT MOLECULAR SCREEN

## 2023-12-26 ENCOUNTER — Telehealth: Payer: Self-pay | Admitting: Medical Genetics

## 2024-01-15 NOTE — Telephone Encounter (Signed)
 [facility]  01/15/2024 3:33PM  Attempted to contact participant 3 times via phone call and MyChart message to discuss TNP results and offer a new [facility] order. Participant may contact the research team at anytime to request a new order.   Jordyn Pennstrom, BS [place]  [facility] Clinical Research Specialist II Direct Dial: [phone]  Fax: [phone]
